# Patient Record
Sex: Female | Born: 1955 | Race: White | Hispanic: No | Smoking: Never smoker
Health system: Southern US, Community
[De-identification: ages and names within clinical notes are randomized; demographics above are authoritative.]

## PROBLEM LIST (undated history)

## (undated) DIAGNOSIS — F32A Depression, unspecified: Secondary | ICD-10-CM

## (undated) DIAGNOSIS — I10 Essential (primary) hypertension: Secondary | ICD-10-CM

## (undated) DIAGNOSIS — E119 Type 2 diabetes mellitus without complications: Secondary | ICD-10-CM

## (undated) DIAGNOSIS — L309 Dermatitis, unspecified: Secondary | ICD-10-CM

## (undated) DIAGNOSIS — K219 Gastro-esophageal reflux disease without esophagitis: Secondary | ICD-10-CM

## (undated) DIAGNOSIS — R569 Unspecified convulsions: Secondary | ICD-10-CM

## (undated) DIAGNOSIS — F329 Major depressive disorder, single episode, unspecified: Secondary | ICD-10-CM

## (undated) HISTORY — PX: CHOLECYSTECTOMY: SHX55

## (undated) HISTORY — PX: VAGINAL WOUND CLOSURE / REPAIR: SUR258

## (undated) HISTORY — PX: TONSILLECTOMY: SUR1361

---

## 2010-07-29 ENCOUNTER — Ambulatory Visit: Payer: Self-pay | Admitting: Family Medicine

## 2017-09-02 ENCOUNTER — Other Ambulatory Visit: Payer: Self-pay

## 2017-09-02 ENCOUNTER — Encounter: Payer: Self-pay | Admitting: *Deleted

## 2017-09-02 ENCOUNTER — Emergency Department
Admission: EM | Admit: 2017-09-02 | Discharge: 2017-09-02 | Disposition: A | Discharge status: Home / Self Care | Payer: Medicare Other | Attending: Emergency Medicine | Admitting: Emergency Medicine

## 2017-09-02 ENCOUNTER — Emergency Department: Payer: Medicare Other

## 2017-09-02 ENCOUNTER — Ambulatory Visit
Admission: EM | Admit: 2017-09-02 | Discharge: 2017-09-02 | Disposition: A | Discharge status: Other Acute Inpatient Hospital | Payer: Medicare Other | Attending: Family Medicine | Admitting: Family Medicine

## 2017-09-02 DIAGNOSIS — I10 Essential (primary) hypertension: Secondary | ICD-10-CM

## 2017-09-02 DIAGNOSIS — E119 Type 2 diabetes mellitus without complications: Secondary | ICD-10-CM | POA: Diagnosis not present

## 2017-09-02 DIAGNOSIS — R0789 Other chest pain: Secondary | ICD-10-CM | POA: Diagnosis not present

## 2017-09-02 DIAGNOSIS — E1169 Type 2 diabetes mellitus with other specified complication: Secondary | ICD-10-CM | POA: Diagnosis not present

## 2017-09-02 DIAGNOSIS — R0602 Shortness of breath: Secondary | ICD-10-CM | POA: Diagnosis not present

## 2017-09-02 DIAGNOSIS — E669 Obesity, unspecified: Secondary | ICD-10-CM

## 2017-09-02 DIAGNOSIS — Z79899 Other long term (current) drug therapy: Secondary | ICD-10-CM | POA: Insufficient documentation

## 2017-09-02 DIAGNOSIS — Z7982 Long term (current) use of aspirin: Secondary | ICD-10-CM | POA: Diagnosis not present

## 2017-09-02 DIAGNOSIS — R079 Chest pain, unspecified: Secondary | ICD-10-CM | POA: Diagnosis not present

## 2017-09-02 HISTORY — DX: Unspecified convulsions: R56.9

## 2017-09-02 HISTORY — DX: Type 2 diabetes mellitus without complications: E11.9

## 2017-09-02 HISTORY — DX: Essential (primary) hypertension: I10

## 2017-09-02 HISTORY — DX: Depression, unspecified: F32.A

## 2017-09-02 HISTORY — DX: Dermatitis, unspecified: L30.9

## 2017-09-02 HISTORY — DX: Major depressive disorder, single episode, unspecified: F32.9

## 2017-09-02 HISTORY — DX: Gastro-esophageal reflux disease without esophagitis: K21.9

## 2017-09-02 LAB — BASIC METABOLIC PANEL
Anion gap: 11 (ref 5–15)
BUN: 23 mg/dL — AB (ref 6–20)
CO2: 23 mmol/L (ref 22–32)
CREATININE: 0.75 mg/dL (ref 0.44–1.00)
Calcium: 9.7 mg/dL (ref 8.9–10.3)
Chloride: 105 mmol/L (ref 101–111)
GFR calc Af Amer: >60 mL/min (ref >60)
GLUCOSE: 225 mg/dL — AB (ref 65–99)
Potassium: 3.8 mmol/L (ref 3.5–5.1)
SODIUM: 139 mmol/L (ref 135–145)

## 2017-09-02 LAB — CBC
HEMATOCRIT: 39.4 % (ref 35.0–47.0)
Hemoglobin: 13.3 g/dL (ref 12.0–16.0)
MCH: 29.5 pg (ref 26.0–34.0)
MCHC: 33.7 g/dL (ref 32.0–36.0)
MCV: 87.4 fL (ref 80.0–100.0)
PLATELETS: 362 10*3/uL (ref 150–440)
RBC: 4.5 MIL/uL (ref 3.80–5.20)
RDW: 13.3 % (ref 11.5–14.5)
WBC: 9.6 10*3/uL (ref 3.6–11.0)

## 2017-09-02 LAB — TROPONIN I: Troponin I: <0.03 ng/mL (ref <0.03)

## 2017-09-02 MED ORDER — ASPIRIN 81 MG PO CHEW
324.0000 mg | CHEWABLE_TABLET | Freq: Once | ORAL | Status: AC
  Administered 2017-09-02: 324 mg via ORAL

## 2017-09-02 MED ORDER — METOPROLOL TARTRATE 50 MG PO TABS
50.0000 mg | ORAL_TABLET | Freq: Once | ORAL | Status: AC
  Administered 2017-09-02: 50 mg via ORAL

## 2017-09-02 MED ORDER — NITROGLYCERIN 0.4 MG SL SUBL
0.4000 mg | SUBLINGUAL_TABLET | SUBLINGUAL | Status: DC | PRN
  Administered 2017-09-02: 0.4 mg via SUBLINGUAL

## 2017-09-02 NOTE — ED Provider Notes (Signed)
[facility] Emergency Department Provider Note  ____________________________________________  Time seen: Approximately 8:14 PM  I have reviewed the triage vital signs and the nursing notes.   HISTORY  Chief Complaint Chest Pain    HPI April Burton is a 61 y.o. female comes to the ED due to central chest pain today radiating to left arm and jaw. Relieved with nitroglycerin yesterday. Had recurrent episode of chest pain today, went to urgent care and relieved when given a nitroglycerin.positive shortness of breath, no vomiting or diaphoresis. Not exertional, not pleuritic. Thinks this is similar to hiatal hernia related symptom she has had in the past. She's been compliant with omeprazole. No other acute complaints. No aggravating or alleviating factors to the symptoms which are intermittent. He describes the pain as dull.     Past Medical History:  Diagnosis Date  . Depression   . Diabetes mellitus without complication (HCC)   . Eczema   . GERD (gastroesophageal reflux disease)   . Hypertension   . Seizures (HCC)      There are no active problems to display for this patient.    Past Surgical History:  Procedure Laterality Date  . TONSILLECTOMY    . VAGINAL WOUND CLOSURE / REPAIR       Prior to Admission medications   Medication Sig Start Date End Date Taking? Authorizing Provider  aspirin EC 81 MG tablet Take 81 mg by mouth daily.    [provider]  atenolol (TENORMIN) 50 MG tablet Take 50 mg by mouth daily.    [provider]  atorvastatin (LIPITOR) 40 MG tablet Take 40 mg by mouth daily.    [provider]  cloNIDine (CATAPRES) 0.1 MG tablet Take 0.1 mg by mouth 3 (three) times daily.    [provider]  docusate sodium (COLACE) 100 MG capsule Take 100 mg by mouth 2 (two) times daily.    [provider]  enalapril (VASOTEC) 20 MG tablet Take 20 mg by mouth 2 (two) times daily.    [provider]  EPINEPHrine (EPIPEN 2-PAK) 0.3 mg/0.3 mL IJ SOAJ injection Inject 0.3 mg into the muscle once.    [provider]  esomeprazole (NEXIUM) 40 MG capsule Take 40 mg by mouth daily at 12 noon.    [provider]  FLUoxetine (PROZAC) 20 MG capsule Take 60 mg by mouth daily.    [provider]  gabapentin (NEURONTIN) 300 MG capsule Take 600 mg by mouth 3 (three) times daily.    [provider]  gemfibrozil (LOPID) 600 MG tablet Take 600 mg by mouth 2 (two) times daily before a meal.    [provider]  hydrochlorothiazide (HYDRODIURIL) 12.5 MG tablet Take 12.5 mg by mouth daily.    [provider]  ibuprofen (ADVIL,MOTRIN) 200 MG tablet Take 200 mg by mouth every 6 (six) hours as needed.    [provider]  insulin glargine (LANTUS) 100 UNIT/ML injection Inject 50 Units into the skin 2 (two) times daily.    [provider]  insulin lispro (HUMALOG) 100 UNIT/ML injection Inject into the skin 3 (three) times daily before meals.    [provider]  metFORMIN (GLUMETZA) 500 MG (MOD) 24 hr tablet Take 1,000 mg by mouth 2 (two) times daily with a meal.    [provider]  Multiple Vitamin (MULTIVITAMIN) tablet Take 1 tablet by mouth daily.    [provider]  Naloxone HCl (EVZIO) 0.4 MG/0.4ML SOAJ Inject  as directed.    [provider]  nystatin cream (MYCOSTATIN) Apply 1 application topically 2 (two) times daily.    [provider]  omega-3 acid ethyl esters (LOVAZA) 1 g capsule Take by mouth 2 (two) times daily.    [provider]  promethazine (PHENERGAN) 25 MG tablet Take 25 mg by mouth every 8 (eight) hours as needed for nausea or vomiting.    [provider]  SENNA-FENNEL PO Take 1 tablet by mouth 2 (two) times daily.    [provider]     Allergies Adhesive [tape]; Diclofenac sodium; Neomycin-bacitracin-polymyxin  [bacitracin-neomycin-polymyxin]; and Pregabalin   Family History  Problem Relation Age of Onset  . CAD Father     Social History Social History   Tobacco Use  . Smoking status: Never Smoker  . Smokeless tobacco: Never Used  Substance Use Topics  . Alcohol use: No    Frequency: Never  . Drug use: No    Review of Systems  Constitutional:   No fever or chills.  ENT:   No sore throat. No rhinorrhea. Cardiovascular:   positive as above chest pain without syncope. Respiratory:   positive as above shortness of breath with pain without cough. Gastrointestinal:   Negative for abdominal pain, vomiting and diarrhea.  Musculoskeletal:   Negative for focal pain or swelling All other systems reviewed and are negative except as documented above in ROS and HPI.  ____________________________________________   PHYSICAL EXAM:  VITAL SIGNS: ED Triage Vitals  Enc Vitals Group     BP 09/02/17 1544 (!) 159/64     Pulse Rate 09/02/17 1544 84     Resp 09/02/17 1544 18     Temp 09/02/17 1544 98.6 F (37 C)     Temp Source 09/02/17 1544 Oral     SpO2 09/02/17 1544 98 %     Weight 09/02/17 1538 210 lb (95.3 kg)     Height 09/02/17 1538 5\' 4"  (1.626 m)     Head Circumference --      Peak Flow --      Pain Score --      Pain Loc --      Pain Edu? --      Excl. in GC? --     Vital signs reviewed, nursing assessments reviewed.   Constitutional:   Alert and oriented. Well appearing and in no distress. Eyes:   No scleral icterus.  EOMI. No nystagmus. No conjunctival pallor. PERRL. ENT   Head:   Normocephalic and atraumatic.   Nose:   No congestion/rhinnorhea.    Mouth/Throat:   MMM, no pharyngeal erythema. No peritonsillar mass.    Neck:   No meningismus. Full ROM.thyroid nonpalpable Hematological/Lymphatic/Immunilogical:   No cervical lymphadenopathy. Cardiovascular:   RRR. Symmetric bilateral radial and DP pulses.  No murmurs.  Respiratory:   Normal respiratory effort  without tachypnea/retractions. Breath sounds are clear and equal bilaterally. No wheezes/rales/rhonchi. Gastrointestinal:   Soft and nontender. Non distended. There is no CVA tenderness.  No rebound, rigidity, or guarding. Genitourinary:   deferred Musculoskeletal:   Normal range of motion in all extremities. No joint effusions.  No lower extremity tenderness.  No edema. Neurologic:   Normal speech and language.  Motor grossly intact. No acute focal neurologic deficits are appreciated.  Skin:    Skin is warm, dry and intact. No rash noted.  No petechiae, purpura, or bullae.  ____________________________________________    LABS (pertinent positives/negatives) (all labs ordered are listed, but only abnormal  results are displayed) Labs Reviewed  BASIC METABOLIC PANEL - Abnormal; Notable for the following components:      Result Value   Glucose, Bld 225 (*)    BUN 23 (*)    All other components within normal limits  CBC  TROPONIN I  TROPONIN I   ____________________________________________   EKG  interpreted by me Normal sinus rhythm rate of 87, normal axis and intervals. Normal QRS ST segments and T waves. No acute ischemic changes.  [facility] reviewed by me, sinus rhythm, rate of 86, normal axis intervals. No acute ischemic changes.  ____________________________________________    RADIOLOGY  Dg Chest 2 View  Result Date: 09/02/2017 CLINICAL DATA:  Chest pain since yesterday, some relief with nitroglycerin, central chest pain extending to jaw, teeth and LEFT arm, history diabetes mellitus, hypertension EXAM: CHEST - 2 VIEW COMPARISON:  None FINDINGS: Normal heart size, mediastinal contours, and pulmonary vascularity. Lungs clear. No pleural effusion or pneumothorax. Multilevel endplate spur formation thoracic spine. IMPRESSION: No acute abnormalities. Electronically Signed   By: Ulyses SouthwardMark  Boles M.D.   On: 09/02/2017 16:15     ____________________________________________   PROCEDURES Procedures  ____________________________________________    CLINICAL IMPRESSION / ASSESSMENT AND PLAN / ED COURSE  Pertinent labs & imaging results that were available during my care of the patient were reviewed by me and considered in my medical decision making (see chart for details).     Clinical Course as of Sep 02 2029  Caleen EssexFri Sep 02, 2017  1701 the patient presents with precordial chest pain, concerning for cardiac origin. Chest pain resolved 2 hours after nitroglycerin. Once to go home. I'll check delta troponin, engage shared decision making with patient regarding appropriate management of her symptoms.  [PS]  2013 repeat troponin negative. No further pain. Discussed with patient regarding somewhat elevated risk of adverse cardiac event in the next 30 days. Recommended hospitalization and further workup tele monitoring. She strongly prefers to go home and follow up with cardiology outpatient. Advised to return to Hospital if she has any worsening or recurrent unremitting symptoms. We'll discharge, full dose aspirin until evaluated by cardiology.  [PS]    Clinical Course User Index [PS] Sharman CheekStafford, Shalamar Crays, MD     ____________________________________________   FINAL CLINICAL IMPRESSION(S) / ED DIAGNOSES    Final diagnoses:  Nonspecific chest pain     ED Discharge Orders    None      Portions of this note were generated with dragon dictation software. Dictation errors may occur despite best attempts at proofreading.    Sharman CheekStafford, Jun Osment, MD 09/02/17 2031

## 2017-09-02 NOTE — Discharge Instructions (Signed)
Increase your aspirin to 324mg  (4 tablets of 81mg  EC aspirin) a day until you see cardiology.

## 2017-09-02 NOTE — ED Triage Notes (Signed)
Pt started having chest pain yesterday, pt took a nitro yesterday with relief. Pt went to [facility] urgent care today for chest pain, pt states center of chest, jaw, teeth, and left arm. Pt denies pain at this time, pt states that she has similar symptoms with her hiatal hernia

## 2017-09-02 NOTE — ED Provider Notes (Signed)
[facility]    CSN: [id number] Arrival date & time: 09/02/17  1346     History   Chief Complaint Chief Complaint  Patient presents with  . Chest Pain  . Shortness of Breath    HPI April Kendallatricia Burton is a 62 y.o. female.   62 yo female with a multiple cardiac risk factors (diabetes, hypertension, hyperlipidemia) presents with a c/o approximately 30 minutes of substernal chest pressure radiating to her jaw/neck and left arm area and associated with shortness of breath.  States she's had these symptoms intermittently since Wednesday night and an episode yesterday which was relieved with sublingual nitroglycerin. States today she did not take her atenolol.    The history is provided by the patient.  Chest Pain  Associated symptoms: shortness of breath   Shortness of Breath  Associated symptoms: chest pain     Past Medical History:  Diagnosis Date  . Depression   . Diabetes mellitus without complication (HCC)   . Eczema   . GERD (gastroesophageal reflux disease)   . Hypertension   . Seizures (HCC)     There are no active problems to display for this patient.   History reviewed. No pertinent surgical history.  OB History    No data available       Home Medications    Prior to Admission medications   Medication Sig Start Date End Date Taking? Authorizing Provider  aspirin EC 81 MG tablet Take 81 mg by mouth daily.   Yes [provider]  atenolol (TENORMIN) 50 MG tablet Take 50 mg by mouth daily.   Yes [provider]  atorvastatin (LIPITOR) 40 MG tablet Take 40 mg by mouth daily.   Yes [provider]  cloNIDine (CATAPRES) 0.1 MG tablet Take 0.1 mg by mouth 3 (three) times daily.   Yes [provider]  docusate sodium (COLACE) 100 MG capsule Take 100 mg by mouth 2 (two) times daily.   Yes [provider]  enalapril (VASOTEC) 20 MG tablet Take 20 mg by mouth 2 (two) times daily.   Yes [provider]    EPINEPHrine (EPIPEN 2-PAK) 0.3 mg/0.3 mL IJ SOAJ injection Inject 0.3 mg into the muscle once.   Yes [provider]  esomeprazole (NEXIUM) 40 MG capsule Take 40 mg by mouth daily at 12 noon.   Yes [provider]  FLUoxetine (PROZAC) 20 MG capsule Take 60 mg by mouth daily.   Yes [provider]  gabapentin (NEURONTIN) 300 MG capsule Take 600 mg by mouth 3 (three) times daily.   Yes [provider]  gemfibrozil (LOPID) 600 MG tablet Take 600 mg by mouth 2 (two) times daily before a meal.   Yes [provider]  hydrochlorothiazide (HYDRODIURIL) 12.5 MG tablet Take 12.5 mg by mouth daily.   Yes [provider]  ibuprofen (ADVIL,MOTRIN) 200 MG tablet Take 200 mg by mouth every 6 (six) hours as needed.   Yes [provider]  insulin glargine (LANTUS) 100 UNIT/ML injection Inject 50 Units into the skin 2 (two) times daily.   Yes [provider]  insulin lispro (HUMALOG) 100 UNIT/ML injection Inject into the skin 3 (three) times daily before meals.   Yes [provider]  metFORMIN (GLUMETZA) 500 MG (MOD) 24 hr tablet Take 1,000 mg by mouth 2 (two) times daily with a meal.   Yes [provider]  Multiple Vitamin (MULTIVITAMIN) tablet Take 1 tablet by mouth daily.   Yes [provider]  Naloxone HCl (EVZIO) 0.4 MG/0.4ML SOAJ Inject as directed.   Yes [provider]  nystatin cream (MYCOSTATIN) Apply 1 application topically 2 (two) times daily.   Yes [provider]  omega-3 acid ethyl esters (LOVAZA) 1 g capsule Take by mouth 2 (two) times daily.   Yes [provider]  promethazine (PHENERGAN) 25 MG tablet Take 25 mg by mouth every 8 (eight) hours as needed for nausea or vomiting.   Yes [provider]  SENNA-FENNEL PO Take 1 tablet by mouth 2 (two) times daily.   Yes [provider]    Family History History reviewed. No pertinent family history.  Social  History Social History   Tobacco Use  . Smoking status: Never Smoker  . Smokeless tobacco: Never Used  Substance Use Topics  . Alcohol use: No    Frequency: Never  . Drug use: No     Allergies   Adhesive [tape]; Diclofenac sodium; Neomycin-bacitracin-polymyxin [bacitracin-neomycin-polymyxin]; and Pregabalin   Review of Systems Review of Systems  Respiratory: Positive for shortness of breath.   Cardiovascular: Positive for chest pain.     Physical Exam Triage Vital Signs ED Triage Vitals  Enc Vitals Group     BP 09/02/17 1403 (!) 200/84     Pulse Rate 09/02/17 1403 99     Resp 09/02/17 1403 18     Temp --      Temp src --      SpO2 09/02/17 1403 98 %     Weight 09/02/17 1402 210 lb (95.3 kg)     Height 09/02/17 1402 5\' 4"  (1.626 m)     Head Circumference --      Peak Flow --      Pain Score 09/02/17 1402 6     Pain Loc --      Pain Edu? --      Excl. in GC? --    No data found.  Updated Vital Signs BP (!) 200/84 (BP Location: Right Arm)   Pulse 99   Resp 18   Ht 5\' 4"  (1.626 m)   Wt 210 lb (95.3 kg)   SpO2 98%   BMI 36.05 kg/m   Visual Acuity Right Eye Distance:   Left Eye Distance:   Bilateral Distance:    Right Eye Near:   Left Eye Near:    Bilateral Near:     Physical Exam  Constitutional: She appears well-developed and well-nourished. No distress.  Cardiovascular: Normal rate, regular rhythm, normal heart sounds and intact distal pulses.  Pulmonary/Chest: Effort normal and breath sounds normal. No stridor. No respiratory distress. She has no wheezes. She has no rales. She exhibits no tenderness.  Abdominal: Soft. Bowel sounds are normal. She exhibits no distension and no mass. There is no tenderness. There is no rebound and no guarding.  Skin: She is not diaphoretic.  Nursing note and vitals reviewed.    UC Treatments / Results  Labs (all labs ordered are listed, but only abnormal results are displayed) Labs Reviewed - No data to  display  EKG  EKG Interpretation None       Radiology No results found.  Procedures ED EKG Date/Time: 09/02/2017 2:40 PM Performed by: Payton Mccallum, MD Authorized by: Payton Mccallum, MD   ECG reviewed by ED Physician in the absence of a cardiologist: yes   Previous ECG:    Previous ECG:  Compared to current   Similarity:  Changes noted Interpretation:    Interpretation: abnormal  Rate:    ECG rate:  86   ECG rate assessment: normal   Rhythm:    Rhythm: sinus rhythm   Ectopy:    Ectopy: PAC   QRS:    QRS axis:  Normal Conduction:    Conduction: normal   ST segments:    ST segments:  Normal T waves:    T waves: normal      (including critical care time)  Medications Ordered in UC Medications  nitroGLYCERIN (NITROSTAT) SL tablet 0.4 mg (0.4 mg Sublingual Given 09/02/17 1447)  aspirin chewable tablet 324 mg (324 mg Oral Given 09/02/17 1439)  metoprolol tartrate (LOPRESSOR) tablet 50 mg (50 mg Oral Given 09/02/17 1446)     Initial Impression / Assessment and Plan / UC Course  I have reviewed the triage vital signs and the nursing notes.  Pertinent labs & imaging results that were available during my care of the patient were reviewed by me and considered in my medical decision making (see chart for details).       Final Clinical Impressions(s) / UC Diagnoses   Final diagnoses:  Pressure in left side of chest  Diabetes mellitus type 2 in obese [facility])  Essential hypertension    ED Discharge Orders    None     1. Possible diagnosis reviewed with patient. Recommend patient go to Emergency Department by EMS for further evaluation and management. Patient given 4 baby ASA, SL NTG 0.4, metoprolol 50mg  po x 2. Report called to charge RN at [facility] ED.    Controlled Substance Prescriptions [place] Controlled Substance Registry consulted? Not Applicable   Payton Mccallum, MD 09/02/17 [phone]

## 2017-09-02 NOTE — ED Triage Notes (Signed)
Patient started having chest pain that radiates to her left arm and in her neck. Patient is presently being tapered of pain medications. Patient also reports anxiety.

## 2017-09-03 ENCOUNTER — Other Ambulatory Visit: Payer: Self-pay

## 2017-09-06 ENCOUNTER — Telehealth: Payer: Self-pay

## 2017-09-06 NOTE — Telephone Encounter (Signed)
Lmov for patient to call back and schedule ED fu appointment  °Seen on 09/02/17 for CP  ° °Will try again at a later time ° °

## 2017-09-07 NOTE — Telephone Encounter (Signed)
Lmov for patient to call back and schedule ED fu appointment  °Seen on 09/02/17 for CP  ° °Will try again at a later time ° °

## 2017-09-13 NOTE — Telephone Encounter (Signed)
Sent letter

## 2020-01-18 ENCOUNTER — Other Ambulatory Visit: Payer: Self-pay

## 2020-01-18 ENCOUNTER — Emergency Department: Payer: Medicare Other

## 2020-01-18 ENCOUNTER — Encounter: Payer: Self-pay | Admitting: *Deleted

## 2020-01-18 ENCOUNTER — Inpatient Hospital Stay
Admission: EM | Admit: 2020-01-18 | Discharge: 2020-01-23 | DRG: 871 | Disposition: A | Discharge status: Home / Self Care | Payer: Medicare Other | Attending: Internal Medicine | Admitting: Internal Medicine

## 2020-01-18 DIAGNOSIS — N390 Urinary tract infection, site not specified: Secondary | ICD-10-CM

## 2020-01-18 DIAGNOSIS — R410 Disorientation, unspecified: Secondary | ICD-10-CM | POA: Diagnosis present

## 2020-01-18 DIAGNOSIS — G8929 Other chronic pain: Secondary | ICD-10-CM | POA: Diagnosis present

## 2020-01-18 DIAGNOSIS — R4182 Altered mental status, unspecified: Secondary | ICD-10-CM

## 2020-01-18 DIAGNOSIS — I10 Essential (primary) hypertension: Secondary | ICD-10-CM

## 2020-01-18 DIAGNOSIS — G9341 Metabolic encephalopathy: Secondary | ICD-10-CM | POA: Diagnosis not present

## 2020-01-18 DIAGNOSIS — Z20822 Contact with and (suspected) exposure to covid-19: Secondary | ICD-10-CM | POA: Diagnosis present

## 2020-01-18 DIAGNOSIS — F411 Generalized anxiety disorder: Secondary | ICD-10-CM | POA: Diagnosis present

## 2020-01-18 DIAGNOSIS — K219 Gastro-esophageal reflux disease without esophagitis: Secondary | ICD-10-CM | POA: Diagnosis present

## 2020-01-18 DIAGNOSIS — F329 Major depressive disorder, single episode, unspecified: Secondary | ICD-10-CM | POA: Diagnosis present

## 2020-01-18 DIAGNOSIS — A4159 Other Gram-negative sepsis: Secondary | ICD-10-CM | POA: Diagnosis not present

## 2020-01-18 DIAGNOSIS — T424X1A Poisoning by benzodiazepines, accidental (unintentional), initial encounter: Secondary | ICD-10-CM | POA: Diagnosis present

## 2020-01-18 DIAGNOSIS — F32A Depression, unspecified: Secondary | ICD-10-CM

## 2020-01-18 DIAGNOSIS — Z7982 Long term (current) use of aspirin: Secondary | ICD-10-CM

## 2020-01-18 DIAGNOSIS — E785 Hyperlipidemia, unspecified: Secondary | ICD-10-CM | POA: Diagnosis present

## 2020-01-18 DIAGNOSIS — Z794 Long term (current) use of insulin: Secondary | ICD-10-CM

## 2020-01-18 DIAGNOSIS — F1120 Opioid dependence, uncomplicated: Secondary | ICD-10-CM

## 2020-01-18 DIAGNOSIS — E119 Type 2 diabetes mellitus without complications: Secondary | ICD-10-CM | POA: Diagnosis present

## 2020-01-18 DIAGNOSIS — D649 Anemia, unspecified: Secondary | ICD-10-CM

## 2020-01-18 DIAGNOSIS — T424X4A Poisoning by benzodiazepines, undetermined, initial encounter: Secondary | ICD-10-CM

## 2020-01-18 DIAGNOSIS — Z79899 Other long term (current) drug therapy: Secondary | ICD-10-CM

## 2020-01-18 DIAGNOSIS — Z8249 Family history of ischemic heart disease and other diseases of the circulatory system: Secondary | ICD-10-CM

## 2020-01-18 DIAGNOSIS — N12 Tubulo-interstitial nephritis, not specified as acute or chronic: Secondary | ICD-10-CM | POA: Diagnosis present

## 2020-01-18 LAB — CBC WITH DIFFERENTIAL/PLATELET
Abs Immature Granulocytes: 0.19 10*3/uL — ABNORMAL HIGH (ref 0.00–0.07)
Basophils Absolute: 0.1 10*3/uL (ref 0.0–0.1)
Basophils Relative: 0 %
Eosinophils Absolute: 0 10*3/uL (ref 0.0–0.5)
Eosinophils Relative: 0 %
HCT: 29.3 % — ABNORMAL LOW (ref 36.0–46.0)
Hemoglobin: 9.4 g/dL — ABNORMAL LOW (ref 12.0–15.0)
Immature Granulocytes: 1 %
Lymphocytes Relative: 4 %
Lymphs Abs: 0.9 10*3/uL (ref 0.7–4.0)
MCH: 28 pg (ref 26.0–34.0)
MCHC: 32.1 g/dL (ref 30.0–36.0)
MCV: 87.2 fL (ref 80.0–100.0)
Monocytes Absolute: 1.2 10*3/uL — ABNORMAL HIGH (ref 0.1–1.0)
Monocytes Relative: 5 %
Neutro Abs: 20.7 10*3/uL — ABNORMAL HIGH (ref 1.7–7.7)
Neutrophils Relative %: 90 %
Platelets: 480 10*3/uL — ABNORMAL HIGH (ref 150–400)
RBC: 3.36 MIL/uL — ABNORMAL LOW (ref 3.87–5.11)
RDW: 14.4 % (ref 11.5–15.5)
WBC: 23.1 10*3/uL — ABNORMAL HIGH (ref 4.0–10.5)
nRBC: 0 % (ref 0.0–0.2)

## 2020-01-18 LAB — LACTIC ACID, PLASMA: Lactic Acid, Venous: 1.4 mmol/L (ref 0.5–1.9)

## 2020-01-18 LAB — URINE DRUG SCREEN, QUALITATIVE (ARMC ONLY)
Amphetamines, Ur Screen: NOT DETECTED
Barbiturates, Ur Screen: NOT DETECTED
Benzodiazepine, Ur Scrn: POSITIVE — AB
Cannabinoid 50 Ng, Ur ~~LOC~~: NOT DETECTED
Cocaine Metabolite,Ur ~~LOC~~: NOT DETECTED
MDMA (Ecstasy)Ur Screen: NOT DETECTED
Methadone Scn, Ur: NOT DETECTED
Opiate, Ur Screen: NOT DETECTED
Phencyclidine (PCP) Ur S: NOT DETECTED
Tricyclic, Ur Screen: NOT DETECTED

## 2020-01-18 LAB — COMPREHENSIVE METABOLIC PANEL
ALT: 20 U/L (ref 0–44)
AST: 18 U/L (ref 15–41)
Albumin: 2.8 g/dL — ABNORMAL LOW (ref 3.5–5.0)
Alkaline Phosphatase: 88 U/L (ref 38–126)
Anion gap: 10 (ref 5–15)
BUN: 22 mg/dL (ref 8–23)
CO2: 28 mmol/L (ref 22–32)
Calcium: 9.2 mg/dL (ref 8.9–10.3)
Chloride: 95 mmol/L — ABNORMAL LOW (ref 98–111)
Creatinine, Ser: 0.84 mg/dL (ref 0.44–1.00)
GFR calc Af Amer: >60 mL/min (ref >60)
GFR calc non Af Amer: >60 mL/min (ref >60)
Glucose, Bld: 331 mg/dL — ABNORMAL HIGH (ref 70–99)
Potassium: 4.5 mmol/L (ref 3.5–5.1)
Sodium: 133 mmol/L — ABNORMAL LOW (ref 135–145)
Total Bilirubin: 1.5 mg/dL — ABNORMAL HIGH (ref 0.3–1.2)
Total Protein: 6.6 g/dL (ref 6.5–8.1)

## 2020-01-18 LAB — URINALYSIS, COMPLETE (UACMP) WITH MICROSCOPIC
Bilirubin Urine: NEGATIVE
Glucose, UA: >=500 mg/dL — AB
Hgb urine dipstick: NEGATIVE
Ketones, ur: 5 mg/dL — AB
Nitrite: NEGATIVE
Protein, ur: 30 mg/dL — AB
Specific Gravity, Urine: 1.015 (ref 1.005–1.030)
WBC, UA: >50 WBC/hpf — ABNORMAL HIGH (ref 0–5)
pH: 5 (ref 5.0–8.0)

## 2020-01-18 LAB — ACETAMINOPHEN LEVEL: Acetaminophen (Tylenol), Serum: <10 ug/mL — ABNORMAL LOW (ref 10–30)

## 2020-01-18 LAB — SALICYLATE LEVEL: Salicylate Lvl: <7 mg/dL — ABNORMAL LOW (ref 7.0–30.0)

## 2020-01-18 MED ORDER — SODIUM CHLORIDE 0.9 % IV BOLUS
500.0000 mL | Freq: Once | INTRAVENOUS | Status: AC
  Administered 2020-01-18: 500 mL via INTRAVENOUS

## 2020-01-18 MED ORDER — SODIUM CHLORIDE 0.9 % IV BOLUS (SEPSIS)
1000.0000 mL | Freq: Once | INTRAVENOUS | Status: AC
  Administered 2020-01-19: 1000 mL via INTRAVENOUS

## 2020-01-18 MED ORDER — SODIUM CHLORIDE 0.9 % IV SOLN
1.0000 g | INTRAVENOUS | Status: DC
  Filled 2020-01-18: qty 10

## 2020-01-18 MED ORDER — ACETAMINOPHEN 650 MG RE SUPP: 650.0000 mg | Freq: Four times a day (QID) | RECTAL | Status: DC | PRN

## 2020-01-18 MED ORDER — INSULIN ASPART 100 UNIT/ML ~~LOC~~ SOLN
0.0000 [IU] | Freq: Every day | SUBCUTANEOUS | Status: DC
  Administered 2020-01-19: 4 [IU] via SUBCUTANEOUS
  Administered 2020-01-19 – 2020-01-21 (×2): 3 [IU] via SUBCUTANEOUS
  Administered 2020-01-22: 2 [IU] via SUBCUTANEOUS
  Filled 2020-01-18 (×4): qty 1

## 2020-01-18 MED ORDER — SODIUM CHLORIDE 0.9 % IV SOLN
1.0000 g | Freq: Once | INTRAVENOUS | Status: AC
  Administered 2020-01-18: 1 g via INTRAVENOUS
  Filled 2020-01-18: qty 10

## 2020-01-18 MED ORDER — SODIUM CHLORIDE 0.9 % IV SOLN: INTRAVENOUS | Status: DC

## 2020-01-18 MED ORDER — ENOXAPARIN SODIUM 40 MG/0.4ML ~~LOC~~ SOLN
40.0000 mg | SUBCUTANEOUS | Status: DC
  Administered 2020-01-19 – 2020-01-23 (×5): 40 mg via SUBCUTANEOUS
  Filled 2020-01-18 (×6): qty 0.4

## 2020-01-18 MED ORDER — INSULIN ASPART 100 UNIT/ML ~~LOC~~ SOLN
0.0000 [IU] | Freq: Three times a day (TID) | SUBCUTANEOUS | Status: DC
  Administered 2020-01-19: 3 [IU] via SUBCUTANEOUS
  Administered 2020-01-19: 8 [IU] via SUBCUTANEOUS
  Administered 2020-01-19 – 2020-01-20 (×2): 5 [IU] via SUBCUTANEOUS
  Administered 2020-01-20: 15 [IU] via SUBCUTANEOUS
  Administered 2020-01-20: 8 [IU] via SUBCUTANEOUS
  Administered 2020-01-21: 5 [IU] via SUBCUTANEOUS
  Administered 2020-01-21: 3 [IU] via SUBCUTANEOUS
  Administered 2020-01-21: 8 [IU] via SUBCUTANEOUS
  Administered 2020-01-22: 5 [IU] via SUBCUTANEOUS
  Administered 2020-01-22: 4 [IU] via SUBCUTANEOUS
  Administered 2020-01-22 – 2020-01-23 (×3): 8 [IU] via SUBCUTANEOUS
  Filled 2020-01-18 (×15): qty 1

## 2020-01-18 MED ORDER — ACETAMINOPHEN 325 MG PO TABS
650.0000 mg | ORAL_TABLET | Freq: Four times a day (QID) | ORAL | Status: DC | PRN
  Administered 2020-01-19 – 2020-01-23 (×6): 650 mg via ORAL
  Filled 2020-01-18 (×6): qty 2

## 2020-01-18 MED ORDER — ONDANSETRON HCL 4 MG/2ML IJ SOLN: 4.0000 mg | Freq: Four times a day (QID) | INTRAMUSCULAR | Status: DC | PRN

## 2020-01-18 MED ORDER — SODIUM CHLORIDE 0.9 % IV BOLUS (SEPSIS)
500.0000 mL | Freq: Once | INTRAVENOUS | Status: AC
  Administered 2020-01-19: 500 mL via INTRAVENOUS

## 2020-01-18 MED ORDER — ACETAMINOPHEN 500 MG PO TABS
1000.0000 mg | ORAL_TABLET | Freq: Once | ORAL | Status: AC
  Administered 2020-01-18: 1000 mg via ORAL
  Filled 2020-01-18: qty 2

## 2020-01-18 MED ORDER — ONDANSETRON HCL 4 MG PO TABS: 4.0000 mg | ORAL_TABLET | Freq: Four times a day (QID) | ORAL | Status: DC | PRN

## 2020-01-18 NOTE — ED Triage Notes (Signed)
EMS report: Pt comes from home where she lives with her sister. Pt's daughter called EMS for her mother x 2 today. Initial EMS did not transport, pt refused and was not altered at that time. 2nd EMS called by daughter and when they arrived, pt was altered and eating her lipgloss. Pt is presently A&O x 2, person and place. Pt endorses taking valium and when asked pt admitted to taking heroin. When asked by primary RN, pt denied heroin use vehemently. Pt is variably attempting to get out of bed w/ less than determined effort. EMS reports pt had valium 2mg  x 90 tabs filled on 01/10/20 and has 11.5 tabs left today. Pt is also rx'd suboxone, but the count of that showed appropriate usage.

## 2020-01-18 NOTE — ED Notes (Signed)
Yellow bracelet and yellow socks on.

## 2020-01-18 NOTE — ED Provider Notes (Signed)
[facility] Emergency Department Provider Note ____________________________________________   First MD Initiated Contact with Patient 01/18/20 2134     (approximate)  I have reviewed the triage vital signs and the nursing notes.   HISTORY  Chief Complaint Altered Mental Status (Pt alternately admits and denies heroin use as well as overdose on Valium. )  Level 5 caveat: History present illness limited due to altered mental status  HPI April Burton is a 64 y.o. female with PMH as noted below who presents for altered mental status.  EMS reported that the daughter called earlier today, however at that time the patient was not altered and declined transport.  She called EMS again this evening, and at that time the patient appeared altered and was trying to eat her lip gloss.  The patient states that she takes Valium, and apparently admitted to the RN that she uses heroin although then denied this when asked again.  EMS reported that the patient had a prescription for 90 tablets of 2 mg Valium filled on 7/15 and has 11 tablets left today.  The patient herself denies acute complaints and states that her "doctor" wanted her to come to the hospital, although I believe she is trying to refer to her daughter.  She is not able to give much coherent history.  Past Medical History:  Diagnosis Date  . Depression   . Diabetes mellitus without complication (HCC)   . Eczema   . GERD (gastroesophageal reflux disease)   . Hypertension   . Seizures [facility])     Patient Active Problem List   Diagnosis Date Noted  . UTI (urinary tract infection) 01/18/2020  . Acute metabolic encephalopathy 01/18/2020  . Type 2 diabetes mellitus without complication (HCC) 01/18/2020  . HTN (hypertension) 01/18/2020  . Depression 01/18/2020  . Buprenorphine dependence (HCC) 01/18/2020  . Benzodiazepine (tranquilizer) overdose, undetermined intent, initial encounter 01/18/2020    Past  Surgical History:  Procedure Laterality Date  . TONSILLECTOMY    . VAGINAL WOUND CLOSURE / REPAIR      Prior to Admission medications   Medication Sig Start Date End Date Taking? Authorizing Provider  aspirin EC 81 MG tablet Take 81 mg by mouth daily.    [provider]  atenolol (TENORMIN) 50 MG tablet Take 50 mg by mouth daily.    [provider]  atorvastatin (LIPITOR) 40 MG tablet Take 40 mg by mouth daily.    [provider]  cloNIDine (CATAPRES) 0.1 MG tablet Take 0.1 mg by mouth 3 (three) times daily.    [provider]  docusate sodium (COLACE) 100 MG capsule Take 100 mg by mouth 2 (two) times daily.    [provider]  enalapril (VASOTEC) 20 MG tablet Take 20 mg by mouth 2 (two) times daily.    [provider]  EPINEPHrine (EPIPEN 2-PAK) 0.3 mg/0.3 mL IJ SOAJ injection Inject 0.3 mg into the muscle once.    [provider]  esomeprazole (NEXIUM) 40 MG capsule Take 40 mg by mouth daily at 12 noon.    [provider]  FLUoxetine (PROZAC) 20 MG capsule Take 60 mg by mouth daily.    [provider]  gabapentin (NEURONTIN) 300 MG capsule Take 600 mg by mouth 3 (three) times daily.    [provider]  gemfibrozil (LOPID) 600 MG tablet Take 600 mg by mouth 2 (two) times daily before a meal.    [provider]  hydrochlorothiazide (HYDRODIURIL) 12.5 MG tablet  Take 12.5 mg by mouth daily.    [provider]  ibuprofen (ADVIL,MOTRIN) 200 MG tablet Take 200 mg by mouth every 6 (six) hours as needed.    [provider]  insulin glargine (LANTUS) 100 UNIT/ML injection Inject 50 Units into the skin 2 (two) times daily.    [provider]  insulin lispro (HUMALOG) 100 UNIT/ML injection Inject into the skin 3 (three) times daily before meals.    [provider]  metFORMIN (GLUMETZA) 500 MG (MOD) 24 hr tablet Take 1,000 mg by mouth 2 (two) times daily with a meal.     [provider]  Multiple Vitamin (MULTIVITAMIN) tablet Take 1 tablet by mouth daily.    [provider]  Naloxone HCl (EVZIO) 0.4 MG/0.4ML SOAJ Inject as directed.    [provider]  nystatin cream (MYCOSTATIN) Apply 1 application topically 2 (two) times daily.    [provider]  omega-3 acid ethyl esters (LOVAZA) 1 g capsule Take by mouth 2 (two) times daily.    [provider]  promethazine (PHENERGAN) 25 MG tablet Take 25 mg by mouth every 8 (eight) hours as needed for nausea or vomiting.    [provider]  SENNA-FENNEL PO Take 1 tablet by mouth 2 (two) times daily.    [provider]    Allergies Adhesive [tape], Diclofenac sodium, Neomycin-bacitracin-polymyxin [bacitracin-neomycin-polymyxin], and Pregabalin  Family History  Problem Relation Age of Onset  . CAD Father     Social History Social History   Tobacco Use  . Smoking status: Never Smoker  . Smokeless tobacco: Never Used  Vaping Use  . Vaping Use: Never used  Substance Use Topics  . Alcohol use: No  . Drug use: Yes    Comment: questionable heroin use    Review of Systems Level 5 caveat: Unable to obtain review of systems due to altered mental status   ____________________________________________   PHYSICAL EXAM:  VITAL SIGNS: ED Triage Vitals  Enc Vitals Group     BP 01/18/20 2144 (!) 126/54     Pulse Rate 01/18/20 2144 102     Resp 01/18/20 2144 15     Temp 01/18/20 2144 (!) 102.8 F (39.3 C)     Temp Source 01/18/20 2144 Oral     SpO2 01/18/20 2142 97 %     Weight 01/18/20 2145 (!) 225 lb (102.1 kg)     Height 01/18/20 2145 5\' 8"  (1.727 m)     Head Circumference --      Peak Flow --      Pain Score 01/18/20 2145 0     Pain Loc --      Pain Edu? --      Excl. in GC? --     Constitutional: Alert, confused.  Somewhat weak appearing. Eyes: Conjunctivae are normal.  EOMI.  PERRLA. Head: Atraumatic. Nose: No  congestion/rhinnorhea. Mouth/Throat: Mucous membranes are very dry.   Neck: Normal range of motion.  Cardiovascular: Tachycardic, regular rhythm. Grossly normal heart sounds.  Good peripheral circulation. Respiratory: Normal respiratory effort.  No retractions. Lungs CTAB. Gastrointestinal: Soft and nontender. No distention.  Genitourinary: No flank tenderness. Musculoskeletal: No lower extremity edema.  Extremities warm and well perfused.  Neurologic: Slurred speech.  Motor intact in all extremities. Skin:  Skin is warm and dry. No rash noted. Psychiatric: Somewhat flat affect.  Tired appearing.  ____________________________________________   LABS (all labs ordered are listed, but only abnormal results are displayed)  Labs Reviewed  COMPREHENSIVE METABOLIC PANEL - Abnormal; Notable for the following components:      Result Value   Sodium 133 (*)    Chloride 95 (*)    Glucose, Bld 331 (*)    Albumin 2.8 (*)    Total Bilirubin 1.5 (*)    All other components within normal limits  CBC WITH DIFFERENTIAL/PLATELET - Abnormal; Notable for the following components:   WBC 23.1 (*)    RBC 3.36 (*)    Hemoglobin 9.4 (*)    HCT 29.3 (*)    Platelets 480 (*)    Neutro Abs 20.7 (*)    Monocytes Absolute 1.2 (*)    Abs Immature Granulocytes 0.19 (*)    All other components within normal limits  URINALYSIS, COMPLETE (UACMP) WITH MICROSCOPIC - Abnormal; Notable for the following components:   Color, Urine AMBER (*)    APPearance CLOUDY (*)    Glucose, UA >=500 (*)    Ketones, ur 5 (*)    Protein, ur 30 (*)    Leukocytes,Ua MODERATE (*)    WBC, UA >50 (*)    Bacteria, UA MANY (*)    All other components within normal limits  URINE DRUG SCREEN, QUALITATIVE ([facility] ONLY) - Abnormal; Notable for the following components:   Benzodiazepine, Ur Scrn POSITIVE (*)    All other components within normal limits  ACETAMINOPHEN LEVEL - Abnormal; Notable for the following components:   Acetaminophen  (Tylenol), Serum <10 (*)    All other components within normal limits  SALICYLATE LEVEL - Abnormal; Notable for the following components:   Salicylate Lvl <7.0 (*)    All other components within normal limits  CULTURE, BLOOD (ROUTINE X 2)  CULTURE, BLOOD (ROUTINE X 2)  SARS CORONAVIRUS 2 BY RT PCR (HOSPITAL ORDER, PERFORMED IN [facility])  LACTIC ACID, PLASMA  LACTIC ACID, PLASMA   ____________________________________________  EKG  ED ECG REPORT I, Dionne Bucy, the attending physician, personally viewed and interpreted this ECG.  Date: 01/18/2020 EKG Time: 2155 Rate: 101 Rhythm: Sinus tachycardia QRS Axis: normal Intervals: Intraventricular conduction delay ST/T Wave abnormalities: Nonspecific ST abnormalities Narrative Interpretation: Nonspecific abnormalities with no evidence of acute ischemia  ____________________________________________  RADIOLOGY  CT head: No ICH or other acute ab acute abnormality CXR: No focal infiltrate or edema  _________________________________________   PROCEDURES  Procedure(s) performed: No  Procedures  Critical Care performed: No ____________________________________________   INITIAL IMPRESSION / ASSESSMENT AND PLAN / ED COURSE  Pertinent labs & imaging results that were available during my care of the patient were reviewed by me and considered in my medical decision making (see chart for details).  64 year old female with PMH as noted above presents with altered mental status.  There is concern for possible Valium abuse given that there are fewer pills remaining from a recent prescription than there should be.  The patient herself has no acute complaints.  I reviewed the past medical records in Epic.  The patient was most recently seen in the ED here in 2019 with chest pain.  On exam, the patient is awake but tired appearing.  She is somewhat confused with slurred speech but otherwise nonfocal neurologic exam.   There is no visible trauma.  She is febrile and tachycardic with otherwise normal vital signs.  Given the presence of fever, differential includes infection/sepsis, versus possible Valium or other sedative medication overdose, versus metabolic etiology.  I have a low suspicion for CNS cause given the nonfocal neurologic exam.  We  will obtain CT head, chest x-ray, lab work-up including sepsis labs, and reassess.  I anticipate admission.  ----------------------------------------- 11:34 PM on 01/18/2020 -----------------------------------------  Lab workup and  UA are consistent with UTI.  WBC count is significantly elevated but lactate is normal.  UDS is positive for benzodiazepines.  CT head and CXR are negative.  I ordered ceftriaxone to treat for UTI.  I discussed the case with Dr. Para March from the hospitalist service for admission.  ____________________________________________   FINAL CLINICAL IMPRESSION(S) / ED DIAGNOSES  Final diagnoses:  Altered mental status, unspecified altered mental status type  Urinary tract infection without hematuria, site unspecified      NEW MEDICATIONS STARTED DURING THIS VISIT:  New Prescriptions   No medications on file     Note:  This document was prepared using Dragon voice recognition software and may include unintentional dictation errors.   Dionne Bucy, MD 01/18/20 [phone]

## 2020-01-18 NOTE — ED Notes (Signed)
CT tech states patient tried to remove IV. Sitter is at bedside. Mittens applied. IV site was wrapped with kling before going to CT. Patient is calm and cooperative at this time, but is impulsive.

## 2020-01-18 NOTE — ED Notes (Signed)
Patient was placed on 2L O2 for pulse ox at 90 percent on room air.

## 2020-01-18 NOTE — ED Notes (Addendum)
In and out cath performed by [facility] ED tech with this writer as second.

## 2020-01-18 NOTE — ED Notes (Signed)
Patient taken to CT scan.

## 2020-01-18 NOTE — ED Notes (Signed)
Report given to Meagan RN.

## 2020-01-18 NOTE — H&P (Signed)
History and Physical    April Kendallatricia Chovanec BJY:[id number] DOB: August 21, 1955 DOA: 01/18/2020  PCP: System, Pcp Not In   Patient coming from: Home  I have personally briefly reviewed patient's old medical [facility]  Chief Complaint: Altered mental status  HPI: April Burton is a 64 y.o. female with medical history significant for HTN, DM, chronic pain on Suboxone, anemia, who presented by EMS after daughter reported her being altered and eating her lip gloss.  EMS apparently went to the home twice for the day, the first time she was alert and oriented and declined transport to the hospital.  At the second visit patient was confused.  She reportedly admitted to EMS to be taking both Valium and heroin.  EMS reported that patient received prescription for 90 tablets of Valium on 01/10/2020 and now 7 days later only had 11.5 tablets left.  The Suboxone count showed appropriate usage.  History is limited due to altered mental status ED Course: On arrival, patient was febrile at 102.8, tachycardic at 102, BP 126/54 O2 sat 97% on room air.  WBC 23,000, hemoglobin 9.4.  Lactic acid 1.4.  Urinalysis with pyuria.  Acetaminophen and salicylate levels were both undetectable.  UDS showed positive benzos.  Chest x-ray and CT head with no active disease.  EKG, sinus rhythm with no acute ST-T wave changes.  Patient started on Rocephin.  Hospitalist consulted for admission.  At the time of my evaluation patient was more awake and alert and oriented, denying having taken more Valium than she should  Review of Systems: As per HPI otherwise all other systems on review of systems negative.    Past Medical History:  Diagnosis Date  . Depression   . Diabetes mellitus without complication (HCC)   . Eczema   . GERD (gastroesophageal reflux disease)   . Hypertension   . Seizures (HCC)     Past Surgical History:  Procedure Laterality Date  . TONSILLECTOMY    . VAGINAL WOUND CLOSURE / REPAIR        reports that she has never smoked. She has never used smokeless tobacco. She reports current drug use. She reports that she does not drink alcohol.  Allergies  Allergen Reactions  . Adhesive [Tape] Rash  . Diclofenac Sodium Other (See Comments)    Caused GERD to become worse.  Marland Kitchen. Neomycin-Bacitracin-Polymyxin [Bacitracin-Neomycin-Polymyxin] Rash  . Pregabalin Nausea And Vomiting    Family History  Problem Relation Age of Onset  . CAD Father       Prior to Admission medications   Medication Sig Start Date End Date Taking? Authorizing Provider  aspirin EC 81 MG tablet Take 81 mg by mouth daily.    [provider]  atenolol (TENORMIN) 50 MG tablet Take 50 mg by mouth daily.    [provider]  atorvastatin (LIPITOR) 40 MG tablet Take 40 mg by mouth daily.    [provider]  cloNIDine (CATAPRES) 0.1 MG tablet Take 0.1 mg by mouth 3 (three) times daily.    [provider]  docusate sodium (COLACE) 100 MG capsule Take 100 mg by mouth 2 (two) times daily.    [provider]  enalapril (VASOTEC) 20 MG tablet Take 20 mg by mouth 2 (two) times daily.    [provider]  EPINEPHrine (EPIPEN 2-PAK) 0.3 mg/0.3 mL IJ SOAJ injection Inject 0.3 mg into the muscle once.    [provider]  esomeprazole (NEXIUM) 40 MG capsule Take 40 mg by  mouth daily at 12 noon.    [provider]  FLUoxetine (PROZAC) 20 MG capsule Take 60 mg by mouth daily.    [provider]  gabapentin (NEURONTIN) 300 MG capsule Take 600 mg by mouth 3 (three) times daily.    [provider]  gemfibrozil (LOPID) 600 MG tablet Take 600 mg by mouth 2 (two) times daily before a meal.    [provider]  hydrochlorothiazide (HYDRODIURIL) 12.5 MG tablet Take 12.5 mg by mouth daily.    [provider]  ibuprofen (ADVIL,MOTRIN) 200 MG tablet Take 200 mg by mouth every 6 (six) hours as needed.    [provider]   insulin glargine (LANTUS) 100 UNIT/ML injection Inject 50 Units into the skin 2 (two) times daily.    [provider]  insulin lispro (HUMALOG) 100 UNIT/ML injection Inject into the skin 3 (three) times daily before meals.    [provider]  metFORMIN (GLUMETZA) 500 MG (MOD) 24 hr tablet Take 1,000 mg by mouth 2 (two) times daily with a meal.    [provider]  Multiple Vitamin (MULTIVITAMIN) tablet Take 1 tablet by mouth daily.    [provider]  Naloxone HCl (EVZIO) 0.4 MG/0.4ML SOAJ Inject as directed.    [provider]  nystatin cream (MYCOSTATIN) Apply 1 application topically 2 (two) times daily.    [provider]  omega-3 acid ethyl esters (LOVAZA) 1 g capsule Take by mouth 2 (two) times daily.    [provider]  promethazine (PHENERGAN) 25 MG tablet Take 25 mg by mouth every 8 (eight) hours as needed for nausea or vomiting.    [provider]  SENNA-FENNEL PO Take 1 tablet by mouth 2 (two) times daily.    [provider]    Physical Exam: Vitals:   01/18/20 2142 01/18/20 2144 01/18/20 2145  BP:  (!) 126/54   Pulse:  102   Resp:  15   Temp:  (!) 102.8 F (39.3 C)   TempSrc:  Oral   SpO2: 97% 95%   Weight:   (!) 102.1 kg  Height:   5\' 8"  (1.727 m)     Vitals:   01/18/20 2142 01/18/20 2144 01/18/20 2145  BP:  (!) 126/54   Pulse:  102   Resp:  15   Temp:  (!) 102.8 F (39.3 C)   TempSrc:  Oral   SpO2: 97% 95%   Weight:   (!) 102.1 kg  Height:   5\' 8"  (1.727 m)      Constitutional: Alert and oriented x 3 . Not in any apparent distress HEENT:      Head: Normocephalic and atraumatic.         Eyes: PERLA, EOMI, Conjunctivae are normal. Sclera is non-icteric.       Mouth/Throat: Mucous membranes are moist.       Neck: Supple with no signs of meningismus. Cardiovascular: Regular rate and rhythm. No murmurs, gallops, or rubs. 2+ symmetrical distal pulses are present . No JVD. No LE  edema Respiratory: Respiratory effort normal .Lungs sounds clear bilaterally. No wheezes, crackles, or rhonchi.  Gastrointestinal: Soft, non tende, and non distended with positive bowel sounds. No rebound or guarding. Genitourinary: No CVA tenderness. Musculoskeletal: Nontender with normal range of motion in all extremities. No cyanosis, or erythema of extremities. Neurologic: Normal speech and language. Face is symmetric. Moving all extremities. No gross focal neurologic deficits . Skin: Skin is warm, dry.  No rash or  ulcers Psychiatric: Mood and affect are normal Speech and behavior are normal   Labs on Admission: I have personally reviewed following labs and imaging studies  CBC: Recent Labs  Lab 01/18/20 2158  WBC 23.1*  NEUTROABS 20.7*  HGB 9.4*  HCT 29.3*  MCV 87.2  PLT 480*   Basic Metabolic Panel: Recent Labs  Lab 01/18/20 2158  NA 133*  K 4.5  CL 95*  CO2 28  GLUCOSE 331*  BUN 22  CREATININE 0.84  CALCIUM 9.2   GFR: Estimated Creatinine Clearance: 84.6 mL/min (by C-G formula based on SCr of 0.84 mg/dL). Liver Function Tests: Recent Labs  Lab 01/18/20 2158  AST 18  ALT 20  ALKPHOS 88  BILITOT 1.5*  PROT 6.6  ALBUMIN 2.8*   No results for input(s): LIPASE, AMYLASE in the last 168 hours. No results for input(s): AMMONIA in the last 168 hours. Coagulation Profile: No results for input(s): INR, PROTIME in the last 168 hours. Cardiac Enzymes: No results for input(s): CKTOTAL, CKMB, CKMBINDEX, TROPONINI in the last 168 hours. BNP (last 3 results) No results for input(s): PROBNP in the last 8760 hours. HbA1C: No results for input(s): HGBA1C in the last 72 hours. CBG: No results for input(s): GLUCAP in the last 168 hours. Lipid Profile: No results for input(s): CHOL, HDL, LDLCALC, TRIG, CHOLHDL, LDLDIRECT in the last 72 hours. Thyroid Function Tests: No results for input(s): TSH, T4TOTAL, FREET4, T3FREE, THYROIDAB in the last 72 hours. Anemia  Panel: No results for input(s): VITAMINB12, FOLATE, FERRITIN, TIBC, IRON, RETICCTPCT in the last 72 hours. Urine analysis:    Component Value Date/Time   COLORURINE AMBER (A) 01/18/2020 2240   APPEARANCEUR CLOUDY (A) 01/18/2020 2240   LABSPEC 1.015 01/18/2020 2240   PHURINE 5.0 01/18/2020 2240   GLUCOSEU >=500 (A) 01/18/2020 2240   HGBUR NEGATIVE 01/18/2020 2240   BILIRUBINUR NEGATIVE 01/18/2020 2240   KETONESUR 5 (A) 01/18/2020 2240   PROTEINUR 30 (A) 01/18/2020 2240   NITRITE NEGATIVE 01/18/2020 2240   LEUKOCYTESUR MODERATE (A) 01/18/2020 2240    Radiological Exams on Admission: CT Head Wo Contrast  Result Date: 01/18/2020 CLINICAL DATA:  Altered mental status. EXAM: CT HEAD WITHOUT CONTRAST TECHNIQUE: Contiguous axial images were obtained from the base of the skull through the vertex without intravenous contrast. COMPARISON:  None. FINDINGS: Brain: No evidence of acute infarction, hemorrhage, hydrocephalus, extra-axial collection or mass lesion/mass effect. Vascular: No hyperdense vessel or unexpected calcification. Skull: Normal. Negative for fracture or focal lesion. Sinuses/Orbits: No acute finding. Other: None. IMPRESSION: No acute intracranial pathology. Electronically Signed   By: Aram Candela M.D.   On: 01/18/2020 22:29   DG Chest Port 1 View  Result Date: 01/18/2020 CLINICAL DATA:  Fever EXAM: PORTABLE CHEST 1 VIEW COMPARISON:  09/02/2017 FINDINGS: The heart size and mediastinal contours are within normal limits. Both lungs are clear. The visualized skeletal structures are unremarkable. IMPRESSION: No active disease. Electronically Signed   By: Jasmine Pang M.D.   On: 01/18/2020 22:24    EKG: Independently reviewed. Interpretation : Sinus rhythm with no acute ST-T wave changes  Assessment/Plan 64 year old female HTN, DM, chronic pain on Suboxone, anemia, who presented by EMS after daughter reported her being altered.  Evidence of higher use of Valium on pill count.  On  arrival, febrile and tachycardic with leukocytosis and pyuria    Acute metabolic encephalopathy -Suspect primarily secondary to UTI resulting in confusion that may have led to overuse of Valium.  -IV hydration and supportive care -  Treat UTI -Neurologic checks with fall and aspiration precautions   UTI (urinary tract infection), possible early sepsis -Patient tachycardic and febrile on admission and with altered mental status, leukocytosis of 23,000 with normal lactic acid -Borderline sepsis criteria -IV antibiotics, Rocephin -IV hydration    Benzodiazepine (tranquilizer) overdose, undetermined intent, initial encounter - Pill count on Valium bottles showed 12 pills left from a prescription for 90 pills a week prior on 01/10/2020    Buprenorphine dependence (HCC) -Medication review by EMS reveals appropriate use of buprenorphine. -Patient apparently revealed to EMS that she took heroin and then walked it back.  She was confused at the time    Type 2 diabetes mellitus without complication (HCC) -Insulin sliding scale    HTN (hypertension) -Hold antihypertensives as blood pressure somewhat soft    Depression -Hold sedating psychotropics at this time    Anemia -Hemoglobin 9.4, not far from baseline 9.9 on review of records from care everywhere -Continue to monitor    DVT prophylaxis: Lovenox  Code Status: full code  Family Communication:  none  Disposition Plan: Back to previous home environment Consults called: none  Status:obs    Andris Baumann MD [facility]     01/18/2020, 11:42 PM

## 2020-01-19 DIAGNOSIS — G8929 Other chronic pain: Secondary | ICD-10-CM | POA: Diagnosis present

## 2020-01-19 DIAGNOSIS — F411 Generalized anxiety disorder: Secondary | ICD-10-CM | POA: Diagnosis present

## 2020-01-19 DIAGNOSIS — A419 Sepsis, unspecified organism: Secondary | ICD-10-CM

## 2020-01-19 DIAGNOSIS — E785 Hyperlipidemia, unspecified: Secondary | ICD-10-CM | POA: Diagnosis present

## 2020-01-19 DIAGNOSIS — F1120 Opioid dependence, uncomplicated: Secondary | ICD-10-CM | POA: Diagnosis not present

## 2020-01-19 DIAGNOSIS — Z20822 Contact with and (suspected) exposure to covid-19: Secondary | ICD-10-CM | POA: Diagnosis present

## 2020-01-19 DIAGNOSIS — E119 Type 2 diabetes mellitus without complications: Secondary | ICD-10-CM | POA: Diagnosis present

## 2020-01-19 DIAGNOSIS — T424X1A Poisoning by benzodiazepines, accidental (unintentional), initial encounter: Secondary | ICD-10-CM | POA: Diagnosis present

## 2020-01-19 DIAGNOSIS — G9341 Metabolic encephalopathy: Secondary | ICD-10-CM | POA: Diagnosis present

## 2020-01-19 DIAGNOSIS — Z794 Long term (current) use of insulin: Secondary | ICD-10-CM

## 2020-01-19 DIAGNOSIS — Z8249 Family history of ischemic heart disease and other diseases of the circulatory system: Secondary | ICD-10-CM | POA: Diagnosis not present

## 2020-01-19 DIAGNOSIS — N390 Urinary tract infection, site not specified: Secondary | ICD-10-CM | POA: Diagnosis present

## 2020-01-19 DIAGNOSIS — I10 Essential (primary) hypertension: Secondary | ICD-10-CM

## 2020-01-19 DIAGNOSIS — N12 Tubulo-interstitial nephritis, not specified as acute or chronic: Secondary | ICD-10-CM | POA: Diagnosis present

## 2020-01-19 DIAGNOSIS — K219 Gastro-esophageal reflux disease without esophagitis: Secondary | ICD-10-CM | POA: Diagnosis present

## 2020-01-19 DIAGNOSIS — F329 Major depressive disorder, single episode, unspecified: Secondary | ICD-10-CM

## 2020-01-19 DIAGNOSIS — Z7982 Long term (current) use of aspirin: Secondary | ICD-10-CM | POA: Diagnosis not present

## 2020-01-19 DIAGNOSIS — D649 Anemia, unspecified: Secondary | ICD-10-CM | POA: Diagnosis present

## 2020-01-19 DIAGNOSIS — A4159 Other Gram-negative sepsis: Secondary | ICD-10-CM | POA: Diagnosis present

## 2020-01-19 DIAGNOSIS — Z79899 Other long term (current) drug therapy: Secondary | ICD-10-CM | POA: Diagnosis not present

## 2020-01-19 LAB — SARS CORONAVIRUS 2 BY RT PCR (HOSPITAL ORDER, PERFORMED IN ~~LOC~~ HOSPITAL LAB): SARS Coronavirus 2: NEGATIVE

## 2020-01-19 LAB — BASIC METABOLIC PANEL
Anion gap: 8 (ref 5–15)
BUN: 25 mg/dL — ABNORMAL HIGH (ref 8–23)
CO2: 29 mmol/L (ref 22–32)
Calcium: 9.1 mg/dL (ref 8.9–10.3)
Chloride: 98 mmol/L (ref 98–111)
Creatinine, Ser: 0.84 mg/dL (ref 0.44–1.00)
GFR calc Af Amer: >60 mL/min (ref >60)
GFR calc non Af Amer: >60 mL/min (ref >60)
Glucose, Bld: 276 mg/dL — ABNORMAL HIGH (ref 70–99)
Potassium: 4.3 mmol/L (ref 3.5–5.1)
Sodium: 135 mmol/L (ref 135–145)

## 2020-01-19 LAB — HEMOGLOBIN A1C
Hgb A1c MFr Bld: 8.6 % — ABNORMAL HIGH (ref 4.8–5.6)
Mean Plasma Glucose: 200.12 mg/dL

## 2020-01-19 LAB — CBC
HCT: 25.8 % — ABNORMAL LOW (ref 36.0–46.0)
Hemoglobin: 8.6 g/dL — ABNORMAL LOW (ref 12.0–15.0)
MCH: 28.7 pg (ref 26.0–34.0)
MCHC: 33.3 g/dL (ref 30.0–36.0)
MCV: 86 fL (ref 80.0–100.0)
Platelets: 506 10*3/uL — ABNORMAL HIGH (ref 150–400)
RBC: 3 MIL/uL — ABNORMAL LOW (ref 3.87–5.11)
RDW: 14.6 % (ref 11.5–15.5)
WBC: 23.3 10*3/uL — ABNORMAL HIGH (ref 4.0–10.5)
nRBC: 0 % (ref 0.0–0.2)

## 2020-01-19 LAB — HIV ANTIBODY (ROUTINE TESTING W REFLEX): HIV Screen 4th Generation wRfx: NONREACTIVE

## 2020-01-19 LAB — GLUCOSE, CAPILLARY
Glucose-Capillary: 269 mg/dL — ABNORMAL HIGH (ref 70–99)
Glucose-Capillary: 197 mg/dL — ABNORMAL HIGH (ref 70–99)
Glucose-Capillary: 273 mg/dL — ABNORMAL HIGH (ref 70–99)
Glucose-Capillary: 315 mg/dL — ABNORMAL HIGH (ref 70–99)

## 2020-01-19 LAB — PROCALCITONIN: Procalcitonin: 2.48 ng/mL

## 2020-01-19 LAB — APTT: aPTT: 29 seconds (ref 24–36)

## 2020-01-19 LAB — LACTIC ACID, PLASMA: Lactic Acid, Venous: 1.3 mmol/L (ref 0.5–1.9)

## 2020-01-19 LAB — PROTIME-INR
INR: 1.2 (ref 0.8–1.2)
Prothrombin Time: 15.1 seconds (ref 11.4–15.2)

## 2020-01-19 MED ORDER — SODIUM CHLORIDE 0.9 % IV SOLN
2.0000 g | INTRAVENOUS | Status: DC
  Administered 2020-01-19 – 2020-01-20 (×2): 2 g via INTRAVENOUS
  Filled 2020-01-19: qty 2
  Filled 2020-01-19: qty 20
  Filled 2020-01-19: qty 2
  Filled 2020-01-19: qty 20

## 2020-01-19 MED ORDER — INSULIN GLARGINE 100 UNIT/ML ~~LOC~~ SOLN
20.0000 [IU] | Freq: Every day | SUBCUTANEOUS | Status: DC
  Administered 2020-01-19: 20 [IU] via SUBCUTANEOUS
  Filled 2020-01-19 (×3): qty 0.2

## 2020-01-19 MED ORDER — FAMOTIDINE 20 MG PO TABS
40.0000 mg | ORAL_TABLET | Freq: Every day | ORAL | Status: DC
  Administered 2020-01-19 – 2020-01-22 (×4): 40 mg via ORAL
  Filled 2020-01-19 (×4): qty 2

## 2020-01-19 MED ORDER — ATORVASTATIN CALCIUM 20 MG PO TABS
40.0000 mg | ORAL_TABLET | Freq: Every day | ORAL | Status: DC
  Administered 2020-01-19 – 2020-01-22 (×4): 40 mg via ORAL
  Filled 2020-01-19 (×4): qty 2

## 2020-01-19 MED ORDER — ATENOLOL 25 MG PO TABS
25.0000 mg | ORAL_TABLET | Freq: Two times a day (BID) | ORAL | Status: DC
  Administered 2020-01-19 – 2020-01-23 (×9): 25 mg via ORAL
  Filled 2020-01-19 (×10): qty 1

## 2020-01-19 MED ORDER — PANTOPRAZOLE SODIUM 40 MG PO TBEC
40.0000 mg | DELAYED_RELEASE_TABLET | Freq: Every day | ORAL | Status: DC
  Administered 2020-01-19 – 2020-01-23 (×6): 40 mg via ORAL
  Filled 2020-01-19 (×6): qty 1

## 2020-01-19 MED ORDER — SODIUM CHLORIDE 0.9 % IV SOLN
1.0000 g | Freq: Once | INTRAVENOUS | Status: AC
  Administered 2020-01-19: 1 g via INTRAVENOUS
  Filled 2020-01-19: qty 1

## 2020-01-19 NOTE — Progress Notes (Signed)
Received patient into 148. Patient alert and oriented x 4 and showing no signs of distress. Vital signs stable. Call bell within reach.

## 2020-01-19 NOTE — Progress Notes (Signed)
   01/19/20 1019  Assess: MEWS Score  Temp (!) 103.2 F (39.6 C)  BP (!) 154/55  Pulse Rate (!) 108  Resp (!) 24  SpO2 97 %  O2 Device Room Air  Assess: MEWS Score  MEWS Temp 2  MEWS Systolic 0  MEWS Pulse 1  MEWS RR 1  MEWS LOC 0  MEWS Score 4  MEWS Score Color Red  Assess: if the MEWS score is Yellow or Red  Were vital signs taken at a resting state? Yes  Focused Assessment Change from prior assessment (see assessment flowsheet)  Early Detection of Sepsis Score *See Row Information* High  MEWS guidelines implemented *See Row Information* Yes  Treat  MEWS Interventions Administered scheduled meds/treatments  Take Vital Signs  Increase Vital Sign Frequency  Red: Q 1hr X 4 then Q 4hr X 4, if remains red, continue Q 4hrs  Escalate  MEWS: Escalate Red: discuss with charge nurse/RN and provider, consider discussing with RRT  Notify: Charge Nurse/RN  Name of Charge Nurse/RN Notified Rosey Bath  Date Charge Nurse/RN Notified 01/19/20  Time Charge Nurse/RN Notified 1030  Notify: Provider  Provider Name/Title Nettey  Date Provider Notified 01/19/20  Time Provider Notified 1030  Notification Type Page  Notification Reason Change in status  Response No new orders  Date of Provider Response 01/19/20  Time of Provider Response 1100  Document  Patient Outcome Stabilized after interventions  Progress note created (see row info) Yes
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
## 2020-01-19 NOTE — Progress Notes (Signed)
CODE SEPSIS - PHARMACY COMMUNICATION  **Broad Spectrum Antibiotics should be administered within 1 hour of Sepsis diagnosis**  Time Code Sepsis Called/Page Received: 2356  Antibiotics Ordered: Rocephin  Time of 1st antibiotic administration: 2332 (already given)  Additional action taken by pharmacy: n/a  If necessary, Name of Provider/Nurse Contacted: n/a    Wayland Denis ,PharmD Clinical Pharmacist  01/19/2020  2:29 AM
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
## 2020-01-19 NOTE — Progress Notes (Signed)
Bladder scanned her just now after urinating twice and no urine is being retained in bladder.

## 2020-01-19 NOTE — Progress Notes (Signed)
Called lab to add urine culture to urine already in lab

## 2020-01-20 LAB — IRON AND TIBC
Iron: 21 ug/dL — ABNORMAL LOW (ref 28–170)
Saturation Ratios: 12 % (ref 10.4–31.8)
TIBC: 179 ug/dL — ABNORMAL LOW (ref 250–450)
UIBC: 158 ug/dL

## 2020-01-20 LAB — CBC
HCT: 29.3 % — ABNORMAL LOW (ref 36.0–46.0)
Hemoglobin: 9.3 g/dL — ABNORMAL LOW (ref 12.0–15.0)
MCH: 28.5 pg (ref 26.0–34.0)
MCHC: 31.7 g/dL (ref 30.0–36.0)
MCV: 89.9 fL (ref 80.0–100.0)
Platelets: 569 10*3/uL — ABNORMAL HIGH (ref 150–400)
RBC: 3.26 MIL/uL — ABNORMAL LOW (ref 3.87–5.11)
RDW: 14.8 % (ref 11.5–15.5)
WBC: 22 10*3/uL — ABNORMAL HIGH (ref 4.0–10.5)
nRBC: 0 % (ref 0.0–0.2)

## 2020-01-20 LAB — GLUCOSE, CAPILLARY
Glucose-Capillary: 244 mg/dL — ABNORMAL HIGH (ref 70–99)
Glucose-Capillary: 293 mg/dL — ABNORMAL HIGH (ref 70–99)
Glucose-Capillary: 384 mg/dL — ABNORMAL HIGH (ref 70–99)
Glucose-Capillary: 183 mg/dL — ABNORMAL HIGH (ref 70–99)

## 2020-01-20 LAB — PROCALCITONIN: Procalcitonin: 1.76 ng/mL

## 2020-01-20 LAB — FERRITIN: Ferritin: 447 ng/mL — ABNORMAL HIGH (ref 11–307)

## 2020-01-20 MED ORDER — INSULIN GLARGINE 100 UNIT/ML ~~LOC~~ SOLN
40.0000 [IU] | Freq: Every day | SUBCUTANEOUS | Status: DC
  Administered 2020-01-20 – 2020-01-22 (×3): 40 [IU] via SUBCUTANEOUS
  Filled 2020-01-20 (×3): qty 0.4

## 2020-01-20 NOTE — Progress Notes (Signed)
PROGRESS NOTE    April Burton  YNW:[id number] DOB: Mar 24, 1956 DOA: 01/18/2020 PCP: System, Pcp Not In   Brief Narrative: April Burton is a 64 y.o. female with medical history significant for HTN, DM, chronic pain on Suboxone, anemia, depression. Patient presented secondary to altered mental status in setting of UTI and sepsis, present on admission. Patient started empirically on ceftriaxone and blood cultures obtained. No urine culture obtained on admission.   Assessment & Plan:   Principal Problem:   Acute metabolic encephalopathy Active Problems:   UTI (urinary tract infection)   Type 2 diabetes mellitus without complication (HCC)   HTN (hypertension)   Depression   Buprenorphine dependence (HCC)   Benzodiazepine (tranquilizer) overdose, undetermined intent, initial encounter   Anemia   Acute metabolic encephalopathy Present on admission. Patient has some confusion. Some of this appear to be behavioral, however. Resolved. -Treat infection below  UTI Pyelonephritis Urinalysis is highly suggestive of infection. Since patient incurred high fevers, will treat as pyelonephritis. Blood cultures are no growth to date. Urine culture significant for Enterobacter hormaechei; sensitivities pending -Continue Ceftriaxone 2g daily -Follow up blood and urine cultures (urine culture added on to admission urine collection) -PT/OT recommendations pending  Sepsis Present on admission with fever and leukocytosis. Secondary to UTI. No lactic acidosis. No hypotension. Leukocytosis still significantly elevated but trended down slightly. -Antibiotics as mentioned above  Benzodiazepine overdose Per report, this appears to be unintentional. Patient confused and taking more pills than necessary. No symptoms of overdose currently.  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 Patient plans on discontinuing this after discussion with her primary physician.  Essential  hypertension Patient is on atenolol, enalapril, hydrochlorothiazide, spironolactone as an outpatient. BP was soft on admission. Patient appears to have some rebound tachycardia vs tachycardia from infection. -Restart home atenolol and hold other antihypertensives  Diabetes mellitus, type 2 Patient is on Lantus 65 units BID, Humalog 35 units TID with meals, metformin and Ozempic as an outpatient. Last hemoglobin A1C of 8.6 (01/19/2020). Started on SSI inpatient. Blood sugar uncontrolled today.  -Continue SSI -Increase to Lantus 40 units daily  Depression Patient is on Cymbalta 60 mg BID which is a very high dose. Will need to clarify that she is taking this medication. She is also on a high dose of gabapentin at 900 mg TID.  Hyperlipidemia -Continue Lipitor  Anemia Unknown if this is new or chronic. Last hemoglobin available is from 2019. No evidence of bleeding. Iron panel suggests chronic disease picture as anticipated. Will hold off on iron supplementation for now. Anemia is stable.  GERD -Continue Nexium (Protonix while inpatient) and Pepcid   DVT prophylaxis: Lovenox Code Status:   Code Status: Full Code Family Communication: None at bedside Disposition Plan: Discharge in several days, possibly home pending continued management of infection and eventual PT/OT evaluation when mental status improved   Consultants:   None  Procedures:   None  Antimicrobials:  Ceftriaxone    Subjective: Feels much better. Still eager to go home. No other concerns.  Objective: Vitals:   01/19/20 2054 01/19/20 2224 01/20/20 0223 01/20/20 0620  BP: (!) 145/70 (!) 143/54 (!) 124/51 (!) 137/63  Pulse: 87 83 75 88  Resp:  18 16 22   Temp:  99.4 F (37.4 C) 98.7 F (37.1 C) 99.4 F (37.4 C)  TempSrc:  Oral Oral Oral  SpO2: 100% 100% 100% 100%  Weight:      Height:        Intake/Output Summary (  Last 24 hours) at 01/20/2020 0743 Last data filed at 01/20/2020 0536 Gross per 24 hour    Intake 896 ml  Output 3351 ml  Net -2455 ml   Filed Weights   01/18/20 2145  Weight: (!) 102.1 kg    Examination:  General exam: Appears calm and comfortable Respiratory system: Clear to auscultation. Respiratory effort normal. Cardiovascular system: S1 & S2 heard, RRR. No murmurs, rubs, gallops or clicks. Gastrointestinal system: Abdomen is nondistended, soft and nontender. No organomegaly or masses felt. Normal bowel sounds heard. Central nervous system: Alert and oriented. No focal neurological deficits. Musculoskeletal: No edema. No calf tenderness Skin: No cyanosis. No rashes Psychiatry: Judgement and insight appear normal. Mood & affect appropriate.     Data Reviewed: I have personally reviewed following labs and imaging studies  CBC Lab Results  Component Value Date   WBC 22.0 (H) 01/20/2020   RBC 3.26 (L) 01/20/2020   HGB 9.3 (L) 01/20/2020   HCT 29.3 (L) 01/20/2020   MCV 89.9 01/20/2020   MCH 28.5 01/20/2020   PLT 569 (H) 01/20/2020   MCHC 31.7 01/20/2020   RDW 14.8 01/20/2020   LYMPHSABS 0.9 01/18/2020   MONOABS 1.2 (H) 01/18/2020   EOSABS 0.0 01/18/2020   BASOSABS 0.1 01/18/2020     Last metabolic panel Lab Results  Component Value Date   NA 135 01/19/2020   K 4.3 01/19/2020   CL 98 01/19/2020   CO2 29 01/19/2020   BUN 25 (H) 01/19/2020   CREATININE 0.84 01/19/2020   GLUCOSE 276 (H) 01/19/2020   GFRNONAA >60 01/19/2020   GFRAA >60 01/19/2020   CALCIUM 9.1 01/19/2020   PROT 6.6 01/18/2020   ALBUMIN 2.8 (L) 01/18/2020   BILITOT 1.5 (H) 01/18/2020   ALKPHOS 88 01/18/2020   AST 18 01/18/2020   ALT 20 01/18/2020   ANIONGAP 8 01/19/2020    CBG (last 3)  Recent Labs    01/19/20 0743 01/19/20 1640 01/19/20 2048  GLUCAP 197* 273* 315*     GFR: Estimated Creatinine Clearance: 84.6 mL/min (by C-G formula based on SCr of 0.84 mg/dL).  Coagulation Profile: Recent Labs  Lab 01/19/20 0247  INR 1.2    Recent Results (from the past 240  hour(s))  Blood Culture (routine x 2)     Status: None (Preliminary result)   Collection Time: 01/18/20  9:58 PM   Specimen: BLOOD  Result Value Ref Range Status   Specimen Description BLOOD BLOOD LEFT FOREARM  Final   Special Requests   Final    BOTTLES DRAWN AEROBIC AND ANAEROBIC Blood Culture results may not be optimal due to an excessive volume of blood received in culture bottles   Culture   Final    NO GROWTH < 12 HOURS Performed at [facility], [address], [place], [place] [address]    Report Status PENDING  Incomplete  Blood Culture (routine x 2)     Status: None (Preliminary result)   Collection Time: 01/18/20 10:08 PM   Specimen: BLOOD  Result Value Ref Range Status   Specimen Description BLOOD BLOOD RIGHT FOREARM  Final   Special Requests   Final    BOTTLES DRAWN AEROBIC AND ANAEROBIC Blood Culture adequate volume   Culture   Final    NO GROWTH < 12 HOURS Performed at [facility], [address], [place], [place] [address]    Report Status PENDING  Incomplete  SARS Coronavirus 2 by RT PCR (hospital order, performed in [facility]  hospital lab) Nasopharyngeal Nasopharyngeal Swab     Status: None   Collection Time: 01/18/20 11:35 PM   Specimen: Nasopharyngeal Swab  Result Value Ref Range Status   SARS Coronavirus 2 NEGATIVE NEGATIVE Final    Comment: (NOTE) SARS-CoV-2 target nucleic acids are NOT DETECTED.  The SARS-CoV-2 RNA is generally detectable in upper and lower respiratory specimens during the acute phase of infection. The lowest concentration of SARS-CoV-2 viral copies this assay can detect is 250 copies / mL. A negative result does not preclude SARS-CoV-2 infection and should not be used as the sole basis for treatment or other patient management decisions.  A negative result may occur with improper specimen collection / handling, submission of specimen other than nasopharyngeal swab, presence of viral mutation(s) within the areas  targeted by this assay, and inadequate number of viral copies (<250 copies / mL). A negative result must be combined with clinical observations, patient history, and epidemiological information.  Fact Sheet for Patients:   BoilerBrush.com.cy  Fact Sheet for Healthcare Providers: https://pope.com/  This test is not yet approved or  cleared by the [place] FDA and has been authorized for detection and/or diagnosis of SARS-CoV-2 by FDA under an Emergency Use Authorization (EUA).  This EUA will remain in effect (meaning this test can be used) for the duration of the COVID-19 declaration under Section 564(b)(1) of the Act, 21 U.S.C. section 360bbb-3(b)(1), unless the authorization is terminated or revoked sooner.  Performed at [facility], [address], [place], [place] [address]         Radiology Studies: CT Head Wo Contrast  Result Date: 01/18/2020 CLINICAL DATA:  Altered mental status. EXAM: CT HEAD WITHOUT CONTRAST TECHNIQUE: Contiguous axial images were obtained from the base of the skull through the vertex without intravenous contrast. COMPARISON:  None. FINDINGS: Brain: No evidence of acute infarction, hemorrhage, hydrocephalus, extra-axial collection or mass lesion/mass effect. Vascular: No hyperdense vessel or unexpected calcification. Skull: Normal. Negative for fracture or focal lesion. Sinuses/Orbits: No acute finding. Other: None. IMPRESSION: No acute intracranial pathology. Electronically Signed   By: Aram Candela M.D.   On: 01/18/2020 22:29   DG Chest Port 1 View  Result Date: 01/18/2020 CLINICAL DATA:  Fever EXAM: PORTABLE CHEST 1 VIEW COMPARISON:  09/02/2017 FINDINGS: The heart size and mediastinal contours are within normal limits. Both lungs are clear. The visualized skeletal structures are unremarkable. IMPRESSION: No active disease. Electronically Signed   By: Jasmine Pang M.D.   On: 01/18/2020  22:24        Scheduled Meds: . atenolol  25 mg Oral BID  . atorvastatin  40 mg Oral QHS  . enoxaparin (LOVENOX) injection  40 mg Subcutaneous Q24H  . famotidine  40 mg Oral QHS  . insulin aspart  0-15 Units Subcutaneous TID WC  . insulin aspart  0-5 Units Subcutaneous QHS  . insulin glargine  40 Units Subcutaneous Daily  . pantoprazole  40 mg Oral Daily   Continuous Infusions: . sodium chloride 100 mL/hr at 01/19/20 2047  . cefTRIAXone (ROCEPHIN)  IV 2 g (01/19/20 2109)     LOS: 1 day     Jacquelin Hawking, MD [facility] 01/20/2020, 7:43 AM  If 7PM-7AM, please contact night-coverage [email or web address]

## 2020-01-20 NOTE — Evaluation (Signed)
Physical Therapy Evaluation Patient Details Name: April Burton MRN: [id number] DOB: Dec 11, 1955 Today's Date: 01/20/2020   History of Present Illness  Patient is a 63 y/o F that presents with confusion/AMS, UTI, and sepsis. She had an apparent overdose accidentally it appears per medical records.  Clinical Impression  Patient is a 64 y/o F that is independent with mobility at baseline. She was standing in the room when PT entered, was able to perform 5x sit to stand test quickly w/o use of UEs and able to complete all mobility and transfers independently. She was able to ambulate a full lap around RN station at appropriate gait speed, no obvious deficits observed. No needs identified, PT will sign off.      No PT follow up    Equipment Recommendations       Recommendations for Other Services       Precautions / Restrictions Precautions Precautions: None Restrictions Weight Bearing Restrictions: No      Mobility  Bed Mobility Overal bed mobility: Independent             General bed mobility comments: No deficits observed  Transfers Overall transfer level: Independent               General transfer comment: No deficits observed  Ambulation/Gait Ambulation/Gait assistance: Independent Gait Distance (Feet): 200 Feet Assistive device: None Gait Pattern/deviations: WFL(Within Functional Limits)   Gait velocity interpretation: 1.31 - 2.62 ft/sec, indicative of limited community ambulator General Gait Details: No obvious deficits observed with ambulation.  Stairs            Wheelchair Mobility    Modified Rankin (Stroke Patients Only)       Balance Overall balance assessment: Independent                                           Pertinent Vitals/Pain Pain Assessment: No/denies pain    Home Living Family/patient expects to be discharged to:: Private residence Living Arrangements: Children Available Help at Discharge:  Family Type of Home: House           Additional Comments: Patient is independent with mobility at baseline, lives with daughter    Prior Function Level of Independence: Independent               Hand Dominance        Extremity/Trunk Assessment   Upper Extremity Assessment Upper Extremity Assessment: Overall WFL for tasks assessed    Lower Extremity Assessment Lower Extremity Assessment: Overall WFL for tasks assessed       Communication   Communication: No difficulties  Cognition Arousal/Alertness: Awake/alert Behavior During Therapy: WFL for tasks assessed/performed Overall Cognitive Status: Within Functional Limits for tasks assessed                                        General Comments      Exercises     Assessment/Plan    PT Assessment Patent does not need any further PT services  PT Problem List         PT Treatment Interventions      PT Goals (Current goals can be found in the Care Plan section)  Acute Rehab PT Goals Patient Stated Goal: To return home PT Goal Formulation: With patient Time For Goal  Achievement: 02/03/20 Potential to Achieve Goals: Good    Frequency     Barriers to discharge        Co-evaluation               AM-PAC PT "6 Clicks" Mobility  Outcome Measure Help needed turning from your back to your side while in a flat bed without using bedrails?: None Help needed moving from lying on your back to sitting on the side of a flat bed without using bedrails?: None Help needed moving to and from a bed to a chair (including a wheelchair)?: None Help needed standing up from a chair using your arms (e.g., wheelchair or bedside chair)?: None Help needed to walk in hospital room?: None Help needed climbing 3-5 steps with a railing? : None 6 Click Score: 24    End of Session Equipment Utilized During Treatment: Gait belt Activity Tolerance: Patient tolerated treatment well Patient left: in bed;with  nursing/sitter in room Nurse Communication: Mobility status PT Visit Diagnosis: Muscle weakness (generalized) (M62.81)    Time: 3159-4585 PT Time Calculation (min) (ACUTE ONLY): 6 min   Charges:   PT Evaluation $PT Eval Low Complexity: 1 Low        Alva Garnet PT, DPT, CSCS    {01/20/2020, 1:47 PM

## 2020-01-20 NOTE — Progress Notes (Signed)
Pt starting to get confused. Had to cancel telemetry sitter and place a 1:1 sitter in with her.

## 2020-01-20 NOTE — Progress Notes (Signed)
Arrived to place patient on cpap. Patient in trendelenberg position in bed. C/o issues with back. States uses cpap at home sometimes. Asked me if I use a cpap, I said no.  Patient then stated, well when you put one on then I will put one on.  So, I just asked patient to have RN contact RT department if she changes her mind. Cpap on standby

## 2020-01-21 DIAGNOSIS — R410 Disorientation, unspecified: Secondary | ICD-10-CM | POA: Diagnosis present

## 2020-01-21 LAB — CBC
HCT: 24.3 % — ABNORMAL LOW (ref 36.0–46.0)
Hemoglobin: 8.1 g/dL — ABNORMAL LOW (ref 12.0–15.0)
MCH: 28.8 pg (ref 26.0–34.0)
MCHC: 33.3 g/dL (ref 30.0–36.0)
MCV: 86.5 fL (ref 80.0–100.0)
Platelets: 406 10*3/uL — ABNORMAL HIGH (ref 150–400)
RBC: 2.81 MIL/uL — ABNORMAL LOW (ref 3.87–5.11)
RDW: 14.7 % (ref 11.5–15.5)
WBC: 18.4 10*3/uL — ABNORMAL HIGH (ref 4.0–10.5)
nRBC: 0 % (ref 0.0–0.2)

## 2020-01-21 LAB — GLUCOSE, CAPILLARY
Glucose-Capillary: 273 mg/dL — ABNORMAL HIGH (ref 70–99)
Glucose-Capillary: 198 mg/dL — ABNORMAL HIGH (ref 70–99)
Glucose-Capillary: 269 mg/dL — ABNORMAL HIGH (ref 70–99)
Glucose-Capillary: 235 mg/dL — ABNORMAL HIGH (ref 70–99)
Glucose-Capillary: 259 mg/dL — ABNORMAL HIGH (ref 70–99)

## 2020-01-21 LAB — PROCALCITONIN: Procalcitonin: 0.99 ng/mL

## 2020-01-21 LAB — URINE CULTURE: Culture: >=100000 — AB

## 2020-01-21 MED ORDER — DULOXETINE HCL 30 MG PO CPEP
30.0000 mg | ORAL_CAPSULE | Freq: Every day | ORAL | Status: DC
  Administered 2020-01-21 – 2020-01-22 (×2): 30 mg via ORAL
  Filled 2020-01-21 (×3): qty 1

## 2020-01-21 MED ORDER — CIPROFLOXACIN HCL 500 MG PO TABS
500.0000 mg | ORAL_TABLET | Freq: Two times a day (BID) | ORAL | Status: DC
  Administered 2020-01-21 – 2020-01-23 (×4): 500 mg via ORAL
  Filled 2020-01-21 (×6): qty 1

## 2020-01-21 MED ORDER — LORAZEPAM 1 MG PO TABS
1.0000 mg | ORAL_TABLET | Freq: Four times a day (QID) | ORAL | Status: DC | PRN
  Administered 2020-01-21 – 2020-01-23 (×4): 1 mg via ORAL
  Filled 2020-01-21 (×4): qty 1

## 2020-01-21 NOTE — TOC Progression Note (Signed)
Transition of Care [facility]) - Progression Note    Patient Details  Name: April Burton MRN: [id number] Date of Birth: Aug 30, 1955  Transition of Care [facility]) CM/SW [place], RN Phone Number: 01/21/2020, 12:01 PM  Clinical Narrative:   Met with the patient in the room, she wants to leave, She said she needs to take care of her daughter, She feels that we are spying on her, She lives at home with her daughter. She reports that she was taking Saboxine but not any more, I encouraged her to let us treat her infection and blood levels. She said she has  Some Shortness of breath but refuses to allow [place] to put her in the bed for treatment,  The patient has been made IVC, the patient understands that she cant leave, The patient's sister called and said that the patient has been getting delusional and talking crazy, she stated that the patient had been taking medications not how they prescribed, The patient had been driving down the wrong side of the road, the police brought her home and parked her car, the patient had continuously been getting worse.   The patient had taken lipstick and wrote all over the wall at home, She stated that she called her daughter and tried to get her daughter to call and let the Doctor know that she had taken the pills and not the patient, her daughter refused to do so due to it being a lie.  I explained that the patient is IVC. I explained what IVC means.  I explained that they would need to  Call to speak with the Nurse before attempting to come to visit.           Expected Discharge Plan and Services                                                 Social Determinants of Health (SDOH) Interventions    Readmission Risk Interventions No flowsheet data found.

## 2020-01-21 NOTE — Progress Notes (Signed)
Pt alert but still with some confusion. Pt pulling out IV's trying to get out of bed on own on multiple occasions. 1:1 sitter for safety.

## 2020-01-21 NOTE — Consult Note (Signed)
[facility] Face-to-Face Psychiatry Consult   Reason for Consult:  Delirium, capacity Referring Physician:  Dr Caleb Popp Patient Identification: April Burton MRN:  [id number] Principal Diagnosis: Acute metabolic encephalopathy Diagnosis:  Principal Problem:   Acute metabolic encephalopathy Active Problems:   Acute delirium   UTI (urinary tract infection)   Type 2 diabetes mellitus without complication (HCC)   HTN (hypertension)   Depression   Buprenorphine dependence (HCC)   Benzodiazepine (tranquilizer) overdose, undetermined intent, initial encounter   Anemia   Total Time spent with patient: 45 minutes  Subjective:   April Burton is a 64 y.o. female patient admitted with sepsis.  Patient seen and evaluated in person.  She was told of her medical issues needing to be addressed, low hemoglobin and infection, and stated so I am bleeding yet wants to leave.  Kept saying she would tell them and when asked said them were her doctors.  She did not want to stay here as "this place spies on you and has cameras."  Confused, paranoid, and does not have capacity at this time to make medical decision.  HPI per MD:  April Burton is a 64 y.o. female with medical history significant for HTN, DM, chronic pain on Suboxone, anemia, who presented by EMS after daughter reported her being altered and eating her lip gloss.  EMS apparently went to the home twice for the day, the first time she was alert and oriented and declined transport to the hospital.  At the second visit patient was confused.  She reportedly admitted to EMS to be taking both Valium and heroin.  EMS reported that patient received prescription for 90 tablets of Valium on 01/10/2020 and now 7 days later only had 11.5 tablets left.  The Suboxone count showed appropriate usage.  History is limited due to altered mental status  Past Psychiatric History: depression  Risk to Self:  yes Risk to Others:  none Prior Inpatient Therapy:   none Prior Outpatient Therapy:  unknown  Past Medical History:  Past Medical History:  Diagnosis Date  . Depression   . Diabetes mellitus without complication (HCC)   . Eczema   . GERD (gastroesophageal reflux disease)   . Hypertension   . Seizures (HCC)     Past Surgical History:  Procedure Laterality Date  . TONSILLECTOMY    . VAGINAL WOUND CLOSURE / REPAIR     Family History:  Family History  Problem Relation Age of Onset  . CAD Father    Family Psychiatric  History: none Social History:  Social History   Substance and Sexual Activity  Alcohol Use No     Social History   Substance and Sexual Activity  Drug Use Yes   Comment: questionable heroin use    Social History   Socioeconomic History  . Marital status: Single    Spouse name: Not on file  . Number of children: Not on file  . Years of education: Not on file  . Highest education level: Not on file  Occupational History  . Not on file  Tobacco Use  . Smoking status: Never Smoker  . Smokeless tobacco: Never Used  Vaping Use  . Vaping Use: Never used  Substance and Sexual Activity  . Alcohol use: No  . Drug use: Yes    Comment: questionable heroin use  . Sexual activity: Not on file  Other Topics Concern  . Not on file  Social History Narrative  . Not on file   Social Determinants of Health  Financial Resource Strain:   . Difficulty of Paying Living Expenses:   Food Insecurity:   . Worried About Programme researcher, broadcasting/film/video in the Last Year:   . Barista in the Last Year:   Transportation Needs:   . Freight forwarder (Medical):   Marland Kitchen Lack of Transportation (Non-Medical):   Physical Activity:   . Days of Exercise per Week:   . Minutes of Exercise per Session:   Stress:   . Feeling of Stress :   Social Connections:   . Frequency of Communication with Friends and Family:   . Frequency of Social Gatherings with Friends and Family:   . Attends Religious Services:   . Active Member of  Clubs or Organizations:   . Attends Banker Meetings:   Marland Kitchen Marital Status:    Additional Social History:    Allergies:   Allergies  Allergen Reactions  . Adhesive [Tape] Rash  . Diclofenac Sodium Other (See Comments)    Caused GERD to become worse.  Marland Kitchen Neomycin-Bacitracin-Polymyxin [Bacitracin-Neomycin-Polymyxin] Rash  . Pregabalin Nausea And Vomiting    Labs:  Results for orders placed or performed during the hospital encounter of 01/18/20 (from the past 48 hour(s))  Glucose, capillary     Status: Abnormal   Collection Time: 01/19/20  8:48 PM  Result Value Ref Range   Glucose-Capillary 315 (H) 70 - 99 mg/dL    Comment: Glucose reference range applies only to samples taken after fasting for at least 8 hours.  CBC     Status: Abnormal   Collection Time: 01/20/20  4:55 AM  Result Value Ref Range   WBC 22.0 (H) 4.0 - 10.5 K/uL   RBC 3.26 (L) 3.87 - 5.11 MIL/uL   Hemoglobin 9.3 (L) 12.0 - 15.0 g/dL   HCT 16.1 (L) 36 - 46 %   MCV 89.9 80.0 - 100.0 fL   MCH 28.5 26.0 - 34.0 pg   MCHC 31.7 30.0 - 36.0 g/dL   RDW 09.6 04.5 - 40.9 %   Platelets 569 (H) 150 - 400 K/uL   nRBC 0.0 0.0 - 0.2 %    Comment: Performed at [facility], [address] [place], [place] [address]  Procalcitonin     Status: None   Collection Time: 01/20/20  4:55 AM  Result Value Ref Range   Procalcitonin 1.76 ng/mL    Comment:        Interpretation: PCT > 0.5 ng/mL and <= 2 ng/mL: Systemic infection (sepsis) is possible, but other conditions are known to elevate PCT as well. (NOTE)       Sepsis PCT Algorithm           Lower Respiratory Tract                                      Infection PCT Algorithm    ----------------------------     ----------------------------         PCT < 0.25 ng/mL                PCT < 0.10 ng/mL          Strongly encourage             Strongly discourage   discontinuation of antibiotics    initiation of antibiotics    ----------------------------      -----------------------------       PCT 0.25 -  0.50 ng/mL            PCT 0.10 - 0.25 ng/mL               OR       >80% decrease in PCT            Discourage initiation of                                            antibiotics      Encourage discontinuation           of antibiotics    ----------------------------     -----------------------------         PCT >= 0.50 ng/mL              PCT 0.26 - 0.50 ng/mL                AND       <80% decrease in PCT             Encourage initiation of                                             antibiotics       Encourage continuation           of antibiotics    ----------------------------     -----------------------------        PCT >= 0.50 ng/mL                  PCT > 0.50 ng/mL               AND         increase in PCT                  Strongly encourage                                      initiation of antibiotics    Strongly encourage escalation           of antibiotics                                     -----------------------------                                           PCT <= 0.25 ng/mL                                                 OR                                        > 80% decrease in PCT  Discontinue / Do not initiate                                             antibiotics  Performed at [facility], [address] [place], [place] [address]   Iron and TIBC     Status: Abnormal   Collection Time: 01/20/20  4:55 AM  Result Value Ref Range   Iron 21 (L) 28 - 170 ug/dL   TIBC 250 (L) 539 - 767 ug/dL   Saturation Ratios 12 10.4 - 31.8 %   UIBC 158 ug/dL    Comment: Performed at [facility], [address] [place], [place] [address]  Ferritin     Status: Abnormal   Collection Time: 01/20/20  4:55 AM  Result Value Ref Range   Ferritin 447 (H) 11 - 307 ng/mL    Comment: Performed at [facility], [address] [place], [place] [address]  Glucose,  capillary     Status: Abnormal   Collection Time: 01/20/20  7:59 AM  Result Value Ref Range   Glucose-Capillary 244 (H) 70 - 99 mg/dL    Comment: Glucose reference range applies only to samples taken after fasting for at least 8 hours.  Glucose, capillary     Status: Abnormal   Collection Time: 01/20/20 11:35 AM  Result Value Ref Range   Glucose-Capillary 293 (H) 70 - 99 mg/dL    Comment: Glucose reference range applies only to samples taken after fasting for at least 8 hours.  Glucose, capillary     Status: Abnormal   Collection Time: 01/20/20  4:41 PM  Result Value Ref Range   Glucose-Capillary 384 (H) 70 - 99 mg/dL    Comment: Glucose reference range applies only to samples taken after fasting for at least 8 hours.  Glucose, capillary     Status: Abnormal   Collection Time: 01/20/20  9:25 PM  Result Value Ref Range   Glucose-Capillary 183 (H) 70 - 99 mg/dL    Comment: Glucose reference range applies only to samples taken after fasting for at least 8 hours.  Procalcitonin     Status: None   Collection Time: 01/21/20  5:09 AM  Result Value Ref Range   Procalcitonin 0.99 ng/mL    Comment:        Interpretation: PCT > 0.5 ng/mL and <= 2 ng/mL: Systemic infection (sepsis) is possible, but other conditions are known to elevate PCT as well. (NOTE)       Sepsis PCT Algorithm           Lower Respiratory Tract                                      Infection PCT Algorithm    ----------------------------     ----------------------------         PCT < 0.25 ng/mL                PCT < 0.10 ng/mL          Strongly encourage             Strongly discourage   discontinuation of antibiotics    initiation of antibiotics    ----------------------------     -----------------------------       PCT 0.25 - 0.50  ng/mL            PCT 0.10 - 0.25 ng/mL               OR       >80% decrease in PCT            Discourage initiation of                                            antibiotics      Encourage  discontinuation           of antibiotics    ----------------------------     -----------------------------         PCT >= 0.50 ng/mL              PCT 0.26 - 0.50 ng/mL                AND       <80% decrease in PCT             Encourage initiation of                                             antibiotics       Encourage continuation           of antibiotics    ----------------------------     -----------------------------        PCT >= 0.50 ng/mL                  PCT > 0.50 ng/mL               AND         increase in PCT                  Strongly encourage                                      initiation of antibiotics    Strongly encourage escalation           of antibiotics                                     -----------------------------                                           PCT <= 0.25 ng/mL                                                 OR                                        > 80% decrease in PCT  Discontinue / Do not initiate                                             antibiotics  Performed at [facility], [address] [place], [place] [address]   CBC     Status: Abnormal   Collection Time: 01/21/20  5:09 AM  Result Value Ref Range   WBC 18.4 (H) 4.0 - 10.5 K/uL   RBC 2.81 (L) 3.87 - 5.11 MIL/uL   Hemoglobin 8.1 (L) 12.0 - 15.0 g/dL   HCT 78.6 (L) 36 - 46 %   MCV 86.5 80.0 - 100.0 fL   MCH 28.8 26.0 - 34.0 pg   MCHC 33.3 30.0 - 36.0 g/dL   RDW 76.7 20.9 - 47.0 %   Platelets 406 (H) 150 - 400 K/uL   nRBC 0.0 0.0 - 0.2 %    Comment: Performed at [facility], [address] [place], [place] [address]  Glucose, capillary     Status: Abnormal   Collection Time: 01/21/20  8:03 AM  Result Value Ref Range   Glucose-Capillary 198 (H) 70 - 99 mg/dL    Comment: Glucose reference range applies only to samples taken after fasting for at least 8 hours.  Glucose, capillary     Status: Abnormal   Collection  Time: 01/21/20 12:35 PM  Result Value Ref Range   Glucose-Capillary 269 (H) 70 - 99 mg/dL    Comment: Glucose reference range applies only to samples taken after fasting for at least 8 hours.  Glucose, capillary     Status: Abnormal   Collection Time: 01/21/20  5:11 PM  Result Value Ref Range   Glucose-Capillary 235 (H) 70 - 99 mg/dL    Comment: Glucose reference range applies only to samples taken after fasting for at least 8 hours.    Current Facility-Administered Medications  Medication Dose Route Frequency Provider Last Rate Last Admin  . acetaminophen (TYLENOL) tablet 650 mg  650 mg Oral Q6H PRN Andris Baumann, MD   650 mg at 01/21/20 1354   Or  . acetaminophen (TYLENOL) suppository 650 mg  650 mg Rectal Q6H PRN Andris Baumann, MD      . atenolol (TENORMIN) tablet 25 mg  25 mg Oral BID Narda Bonds, MD   25 mg at 01/21/20 0814  . atorvastatin (LIPITOR) tablet 40 mg  40 mg Oral QHS Narda Bonds, MD   40 mg at 01/20/20 2135  . ciprofloxacin (CIPRO) tablet 500 mg  500 mg Oral BID Narda Bonds, MD   500 mg at 01/21/20 1747  . DULoxetine (CYMBALTA) DR capsule 30 mg  30 mg Oral Daily Charm Rings, NP   30 mg at 01/21/20 1354  . enoxaparin (LOVENOX) injection 40 mg  40 mg Subcutaneous Q24H Andris Baumann, MD   40 mg at 01/21/20 0814  . famotidine (PEPCID) tablet 40 mg  40 mg Oral QHS Narda Bonds, MD   40 mg at 01/20/20 2135  . insulin aspart (novoLOG) injection 0-15 Units  0-15 Units Subcutaneous TID WC Andris Baumann, MD   5 Units at 01/21/20 1747  . insulin aspart (novoLOG) injection 0-5 Units  0-5 Units Subcutaneous QHS Andris Baumann, MD   4 Units at 01/19/20 2105  . insulin glargine (LANTUS) injection 40 Units  40 Units Subcutaneous  Daily Narda BondsNettey, Ralph A, MD   40 Units at 01/21/20 1003  . LORazepam (ATIVAN) tablet 1 mg  1 mg Oral Q6H PRN Charm RingsLord, Omere Marti Y, NP   1 mg at 01/21/20 1241  . ondansetron (ZOFRAN) tablet 4 mg  4 mg Oral Q6H PRN Andris Baumannuncan, Hazel V, MD       Or   . ondansetron [facility](ZOFRAN) injection 4 mg  4 mg Intravenous Q6H PRN Andris Baumannuncan, Hazel V, MD      . pantoprazole (PROTONIX) EC tablet 40 mg  40 mg Oral Daily Narda BondsNettey, Ralph A, MD   40 mg at 01/21/20 1747    Musculoskeletal: Strength & Muscle Tone: decreased Gait & Station: did not witness Patient leans: N/A  Psychiatric Specialty Exam: Physical Exam Vitals and nursing note reviewed.  Constitutional:      Appearance: Normal appearance.  HENT:     Head: Normocephalic.     Nose: Nose normal.  Pulmonary:     Effort: Pulmonary effort is normal.  Musculoskeletal:     Cervical back: Normal range of motion.  Neurological:     General: No focal deficit present.     Mental Status: She is alert.  Psychiatric:        Attention and Perception: She is inattentive.        Mood and Affect: Mood is anxious.        Speech: Speech normal.        Behavior: Behavior is uncooperative.        Thought Content: Thought content is paranoid.        Cognition and Memory: Cognition is impaired. Memory is impaired.        Judgment: Judgment is inappropriate.     Review of Systems  Psychiatric/Behavioral: Positive for confusion. The patient is nervous/anxious.   All other systems reviewed and are negative.   Blood pressure (!) 131/57, pulse 67, temperature 97.7 F (36.5 C), temperature source Oral, resp. rate 21, height 5\' 8"  (1.727 m), weight (!) 102.1 kg, SpO2 100 %.Body mass index is 34.21 kg/m.  General Appearance: Casual  Eye Contact:  Fair  Speech:  Normal Rate  Volume:  Increased  Mood:  Anxious and Irritable  Affect:  Blunt  Thought Process:  Irrelevant at times  Orientation:  Other:  person and place  Thought Content:  Paranoid Ideation  Suicidal Thoughts:  No  Homicidal Thoughts:  No  Memory:  Immediate;   Poor Recent;   Poor Remote;   Poor  Judgement:  Impaired  Insight:  Lacking  Psychomotor Activity:  Normal  Concentration:  Concentration: Poor and Attention Span: Poor  Recall:  Poor   Fund of Knowledge:  Fair  Language:  Fair  Akathisia:  No  Handed:  Right  AIMS (if indicated):     Assets:  Housing Leisure Time Resilience Social Support  ADL's:  Impaired  Cognition:  Impaired,  Moderate  Sleep:        Treatment Plan Summary: Patient does not have capacity to make medical decisions at this time.  Please monitor for benzodiazepine withdrawal - CIWA in place. Delirium: Patient likely has ongoing delirium which presents as fluctuating cognition.  At time of this assessment patient has full capacity.  The patient's exam is notable for altered sensorium, perceptual disturbances, disorientation and cognitive deficits that appear markedly different than their baseline, suggesting a diagnosis of delirium.  Virtually any medical condition or physiologic stress can precipitate delirium in a susceptible individual, with risk increasing in those  with: advanced age, sensory impairments, organic brain disease (stroke, dementia, Parkinsons), psychiatric illness, major chronic medical issues, prolonged hospitalizations, postoperative status, anemia, insomnia/disturbed sleep, and severe pain. Addressing the underlying medical condition and institution of preventative measures are recommended.  - Continue to monitor and treat underlying medical causes of delirium, including infection, electrolyte disturbances, etc. - Delirium precautions - Minimize/avoid deliriogenic meds including: anticholinergic, opiates, benzodiazepines           - Maintain hydration, oxygenation, nutrition           - Limit use of restraints and catheters           - Normalize sleep patterns by minimizing nighttime noise, light and interruptions by     -Ensure sleep apnea treatment is provided overnight.             clustering care, opening blinds during the day           - Reorient the patient frequently, provide easily visible clock and calendar           - Provide sensory aids like glasses, hearing aids            - Encourage ambulation, regular activities and visitors to maintain cognitive stimulation   -Patient would benefit from having family members at bedside to reinforce his orientation.  Disposition: No evidence of imminent risk to self or others at present.   Patient does not meet criteria for psychiatric inpatient admission. Supportive therapy provided about ongoing stressors.  Nanine Means, NP 01/21/2020 6:19 PM

## 2020-01-21 NOTE — Progress Notes (Addendum)
PROGRESS NOTE    April Burton  CXK:[id number] DOB: [birth year] DOA: 01/18/2020 PCP: System, Pcp Not In   Brief Narrative: April Burton is a 64 y.o. female with medical history significant for HTN, DM, chronic pain on Suboxone, anemia, depression. Patient presented secondary to altered mental status in setting of UTI and sepsis, present on admission. Patient started empirically on ceftriaxone and blood cultures obtained. No urine culture obtained on admission.   Assessment & Plan:   Principal Problem:   Acute metabolic encephalopathy Active Problems:   UTI (urinary tract infection)   Type 2 diabetes mellitus without complication (HCC)   HTN (hypertension)   Depression   Buprenorphine dependence (HCC)   Benzodiazepine (tranquilizer) overdose, undetermined intent, initial encounter   Anemia   Acute metabolic encephalopathy Present on admission. Patient has some confusion. Some of this appear to be behavioral, however. Resolved. -Treat infection below  UTI Pyelonephritis Urinalysis is highly suggestive of infection. Since patient incurred high fevers, will treat as pyelonephritis. Blood cultures are no growth to date. Urine culture significant for Enterobacter hormaechei; sensitivities available -Switch to ciprofloxacin 500 mg PO BID  -Follow up blood and urine cultures (urine culture added on to admission urine collection) -PT/OT recommendations pending  Sepsis Present on admission with fever and leukocytosis. Secondary to UTI. No lactic acidosis. No hypotension. Leukocytosis still significantly elevated but trended down slightly. -Antibiotics as mentioned above  Paranoia Lack of capacity Patient attempted to leave the hospital against medical advice. On my evaluation/assessment, I do not think she has capacity as she could not explain the risks/benefits of her decisions in addition to understanding the reason for continued need to be inpatient. Psychiatry consulted  and patient is currently under IVC (01/21/2020) -Psychiatry recommendations  Benzodiazepine overdose Per report, this appears to be unintentional. Patient confused and taking more pills than necessary. No symptoms of overdose currently.  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 Patient plans on discontinuing this after discussion with her primary physician.  Essential hypertension Patient is on atenolol, enalapril, hydrochlorothiazide, spironolactone as an outpatient. BP was soft on admission. Patient appears to have some rebound tachycardia vs tachycardia from infection. -Restart home atenolol and hold other antihypertensives  Diabetes mellitus, type 2 Patient is on Lantus 65 units BID, Humalog 35 units TID with meals, metformin and Ozempic as an outpatient. Last hemoglobin A1C of 8.6 (01/19/2020). Started on SSI inpatient. Blood sugar uncontrolled today.  -Continue SSI -Continue Lantus 40 units daily  Depression Patient is on Cymbalta 60 mg BID which is a very high dose. Will need to clarify that she is taking this medication. She is also on a high dose of gabapentin at 900 mg TID.  Hyperlipidemia -Continue Lipitor  Anemia Unknown if this is new or chronic. Last hemoglobin available is from 2019. No evidence of bleeding. Iron panel suggests chronic disease picture as anticipated. Will hold off on iron supplementation for now. Anemia is stable.  GERD -Continue Nexium (Protonix while inpatient) and Pepcid   DVT prophylaxis: Lovenox Code Status:   Code Status: Full Code Family Communication: None at bedside Disposition Plan: Discharge in several days pending psychiatry recommendations, continued management of infection   Consultants:   Psychiatry  Procedures:   None  Antimicrobials:  Ceftriaxone  Ciprofloxacin   Subjective: Patient is adamant about leaving the hospital to see her daughter. She states that she will get antibiotics from her primary  care physician. When I told her I would check on a few things and get  back to her, she stated that she would leave and that I could get back to her tomorrow.   Objective: Vitals:   01/21/20 0602 01/21/20 0801 01/21/20 1200 01/21/20 1232  BP:  (!) 140/57 (!) 144/54 (!) 144/54  Pulse:  82 82 81  Resp:  16  21  Temp: 99.4 F (37.4 C) 99.7 F (37.6 C)  99.3 F (37.4 C)  TempSrc: Oral Oral  Oral  SpO2:  100%  97%  Weight:      Height:        Intake/Output Summary (Last 24 hours) at 01/21/2020 1533 Last data filed at 01/21/2020 0601 Gross per 24 hour  Intake 1880.06 ml  Output 2100 ml  Net -219.94 ml   Filed Weights   01/18/20 2145  Weight: (!) 102.1 kg    Examination:  General exam: Appears calm and comfortable Respiratory system: Clear to auscultation. Respiratory effort normal. Cardiovascular system: S1 & S2 heard, RRR. No murmurs, rubs, gallops or clicks. Gastrointestinal system: Abdomen is nondistended, soft and nontender. No organomegaly or masses felt. Normal bowel sounds heard. Central nervous system: Alert and oriented. No focal neurological deficits. Musculoskeletal: No edema. No calf tenderness Skin: No cyanosis. No rashes Psychiatry: Judgement and insight appear impaired. Paranoid thoughts.     Data Reviewed: I have personally reviewed following labs and imaging studies  CBC Lab Results  Component Value Date   WBC 18.4 (H) 01/21/2020   RBC 2.81 (L) 01/21/2020   HGB 8.1 (L) 01/21/2020   HCT 24.3 (L) 01/21/2020   MCV 86.5 01/21/2020   MCH 28.8 01/21/2020   PLT 406 (H) 01/21/2020   MCHC 33.3 01/21/2020   RDW 14.7 01/21/2020   LYMPHSABS 0.9 01/18/2020   MONOABS 1.2 (H) 01/18/2020   EOSABS 0.0 01/18/2020   BASOSABS 0.1 01/18/2020     Last metabolic panel Lab Results  Component Value Date   NA 135 01/19/2020   K 4.3 01/19/2020   CL 98 01/19/2020   CO2 29 01/19/2020   BUN 25 (H) 01/19/2020   CREATININE 0.84 01/19/2020   GLUCOSE 276 (H) 01/19/2020     GFRNONAA >60 01/19/2020   GFRAA >60 01/19/2020   CALCIUM 9.1 01/19/2020   PROT 6.6 01/18/2020   ALBUMIN 2.8 (L) 01/18/2020   BILITOT 1.5 (H) 01/18/2020   ALKPHOS 88 01/18/2020   AST 18 01/18/2020   ALT 20 01/18/2020   ANIONGAP 8 01/19/2020    CBG (last 3)  Recent Labs    01/20/20 2125 01/21/20 0803 01/21/20 1235  GLUCAP 183* 198* 269*     GFR: Estimated Creatinine Clearance: 84.6 mL/min (by C-G formula based on SCr of 0.84 mg/dL).  Coagulation Profile: Recent Labs  Lab 01/19/20 0247  INR 1.2    Recent Results (from the past 240 hour(s))  Blood Culture (routine x 2)     Status: None (Preliminary result)   Collection Time: 01/18/20  9:58 PM   Specimen: BLOOD  Result Value Ref Range Status   Specimen Description BLOOD BLOOD LEFT FOREARM  Final   Special Requests   Final    BOTTLES DRAWN AEROBIC AND ANAEROBIC Blood Culture results may not be optimal due to an excessive volume of blood received in culture bottles   Culture   Final    NO GROWTH 3 DAYS Performed at [facility], [address] [place], [place] [address]    Report Status PENDING  Incomplete  Blood Culture (routine x 2)     Status: None (Preliminary result)  Collection Time: 01/18/20 10:08 PM   Specimen: BLOOD  Result Value Ref Range Status   Specimen Description BLOOD BLOOD RIGHT FOREARM  Final   Special Requests   Final    BOTTLES DRAWN AEROBIC AND ANAEROBIC Blood Culture adequate volume   Culture   Final    NO GROWTH 3 DAYS Performed at [facility], [address] [place], [place] [address]    Report Status PENDING  Incomplete  Urine Culture     Status: Abnormal   Collection Time: 01/18/20 10:40 PM   Specimen: Urine, Clean Catch  Result Value Ref Range Status   Specimen Description   Final    URINE, CLEAN CATCH Performed at [facility], [address] [place], [place] [address]    Special Requests   Final    NONE Performed at [facility],  [address] [place], [place] [address]    Culture >=100,000 COLONIES/mL ENTEROBACTER CLOACAE (A)  Final   Report Status 01/21/2020 FINAL  Final   Organism ID, Bacteria ENTEROBACTER CLOACAE (A)  Final      Susceptibility   Enterobacter cloacae - MIC*    CEFAZOLIN >=64 RESISTANT Resistant     CIPROFLOXACIN <=0.25 SENSITIVE Sensitive     GENTAMICIN <=1 SENSITIVE Sensitive     IMIPENEM <=0.25 SENSITIVE Sensitive     NITROFURANTOIN 32 SENSITIVE Sensitive     TRIMETH/SULFA <=20 SENSITIVE Sensitive     PIP/TAZO <=4 SENSITIVE Sensitive     * >=100,000 COLONIES/mL ENTEROBACTER CLOACAE  SARS Coronavirus 2 by RT PCR (hospital order, performed in [facility] hospital lab) Nasopharyngeal Nasopharyngeal Swab     Status: None   Collection Time: 01/18/20 11:35 PM   Specimen: Nasopharyngeal Swab  Result Value Ref Range Status   SARS Coronavirus 2 NEGATIVE NEGATIVE Final    Comment: (NOTE) SARS-CoV-2 target nucleic acids are NOT DETECTED.  The SARS-CoV-2 RNA is generally detectable in upper and lower respiratory specimens during the acute phase of infection. The lowest concentration of SARS-CoV-2 viral copies this assay can detect is 250 copies / mL. A negative result does not preclude SARS-CoV-2 infection and should not be used as the sole basis for treatment or other patient management decisions.  A negative result may occur with improper specimen collection / handling, submission of specimen other than nasopharyngeal swab, presence of viral mutation(s) within the areas targeted by this assay, and inadequate number of viral copies (<250 copies / mL). A negative result must be combined with clinical observations, patient history, and epidemiological information.  Fact Sheet for Patients:   BoilerBrush.com.cyhttps://www.fda.gov/media/136312/download  Fact Sheet for Healthcare Providers: https://pope.com/https://www.fda.gov/media/136313/download  This test is not yet approved or  cleared by the [place] FDA and has  been authorized for detection and/or diagnosis of SARS-CoV-2 by FDA under an Emergency Use Authorization (EUA).  This EUA will remain in effect (meaning this test can be used) for the duration of the COVID-19 declaration under Section 564(b)(1) of the Act, 21 U.S.C. section 360bbb-3(b)(1), unless the authorization is terminated or revoked sooner.  Performed at [facility], [address] [place], [place] [address]         Radiology Studies: No results found.      Scheduled Meds: . atenolol  25 mg Oral BID  . atorvastatin  40 mg Oral QHS  . DULoxetine  30 mg Oral Daily  . enoxaparin (LOVENOX) injection  40 mg Subcutaneous Q24H  . famotidine  40 mg Oral QHS  . insulin aspart  0-15 Units Subcutaneous TID WC  . insulin aspart  0-5 Units Subcutaneous QHS  . insulin glargine  40 Units Subcutaneous Daily  . pantoprazole  40 mg Oral Daily   Continuous Infusions: . sodium chloride 100 mL/hr at 01/21/20 0007  . cefTRIAXone (ROCEPHIN)  IV Stopped (01/20/20 1758)     LOS: 2 days     Jacquelin Hawking, MD [facility] 01/21/2020, 3:33 PM  If 7PM-7AM, please contact night-coverage [email or web address]

## 2020-01-21 NOTE — Evaluation (Signed)
Occupational Therapy Evaluation Patient Details Name: April Burton MRN: [id number] DOB: [birth year] Today's Date: 01/21/2020    History of Present Illness Patient is a 64 y/o F that presents with confusion/AMS, UTI, and sepsis. She had an apparent overdose accidentally it appears per medical records.   Clinical Impression   Pt was seen for OT evaluation this date. Prior to hospital admission, pt was independent and helping to care for daughter who lives with her. Unclear how much assist dtr needs (appears more emotional assist vs physical). Pt eager to return home and expresses frustration towards staff and feeling "observed" all the time (pt currently with 1:1 sitter orders). Emotional support and active listening provided. Currently pt able to perform ADL independently and pt endorses feeling at baseline functionally. Ambulating without assist in room. Preparing to go to bathroom with nurse tech for shower, requiring set up only. Pt demonstrated increased processing time to recall she is here due to "sick from a UTI" but otherwise oriented and able to follow commands well. Anxious to return home to daughter. Do not currently anticipate additional skilled OT needs at this time. Will sign off. Please re-consult if additional acute needs arise. Recommend intermittent supervision for safety upon return home.     Follow Up Recommendations  No OT follow up;Supervision - Intermittent    Equipment Recommendations  None recommended by OT    Recommendations for Other Services       Precautions / Restrictions Precautions Precautions: Fall Restrictions Weight Bearing Restrictions: No      Mobility Bed Mobility               General bed mobility comments: deferred, up in recliner  Transfers Overall transfer level: Independent               General transfer comment: STS without difficulty    Balance Overall balance assessment: Mild deficits observed, not formally tested                                          ADL either performed or assessed with clinical judgement   ADL Overall ADL's : At baseline                                       General ADL Comments: indep with dressing, toilet transfers, and preparing for set up only bath in bathroom with nurse tech     Vision Baseline Vision/History: Wears glasses Wears Glasses: Distance only (pt reports she is supposed to wear glasses for driving, but unable to afford at this time) Patient Visual Report: No change from baseline Vision Assessment?: No apparent visual deficits     Perception     Praxis      Pertinent Vitals/Pain Pain Assessment: No/denies pain     Hand Dominance Right   Extremity/Trunk Assessment Upper Extremity Assessment Upper Extremity Assessment: Overall WFL for tasks assessed   Lower Extremity Assessment Lower Extremity Assessment: Overall WFL for tasks assessed       Communication Communication Communication: No difficulties   Cognition Arousal/Alertness: Awake/alert Behavior During Therapy: WFL for tasks assessed/performed Overall Cognitive Status: No family/caregiver present to determine baseline cognitive functioning  General Comments: Pt alert and oriented x4, required additional time to describe why she was at the hospital but was eventually able to indicate she was sick from a UTI. Follows commands, anxious to get home to care for dtr (although reports dtr does not need assist but just needs encouragement)   General Comments       Exercises     Shoulder Instructions      Home Living Family/patient expects to be discharged to:: Private residence Living Arrangements: Children (daughter) Available Help at Discharge: Family Type of Home: House       Home Layout: One level     Bathroom Shower/Tub: Chief Strategy Officer: Standard     Home Equipment: Environmental consultant - 2  wheels;Bedside commode   Additional Comments: Patient is independent with mobility at baseline, lives with daughter      Prior Functioning/Environment Level of Independence: Independent        Comments: Per pt, indep with mobility, driving, and ADL/IADL. She cares for dtr but unable to specifically note what she needs assist with. Per pt, dtr does most cooking/cleaning and pt indep with ADL/IADL, including med mgt.        OT Problem List: Decreased cognition;Decreased safety awareness      OT Treatment/Interventions:      OT Goals(Current goals can be found in the care plan section) Acute Rehab OT Goals Patient Stated Goal: To return home this morning OT Goal Formulation: All assessment and education complete, DC therapy  OT Frequency:     Barriers to D/C:            Co-evaluation              AM-PAC OT "6 Clicks" Daily Activity     Outcome Measure Help from another person eating meals?: None Help from another person taking care of personal grooming?: None Help from another person toileting, which includes using toliet, bedpan, or urinal?: None Help from another person bathing (including washing, rinsing, drying)?: None Help from another person to put on and taking off regular upper body clothing?: None Help from another person to put on and taking off regular lower body clothing?: None 6 Click Score: 24   End of Session    Activity Tolerance: Patient tolerated treatment well Patient left: with nursing/sitter in room (ambulating to bathroom with nurse tech preparing for bath)  OT Visit Diagnosis: Other abnormalities of gait and mobility (R26.89)                Time: 1610-9604 OT Time Calculation (min): 22 min Charges:  OT General Charges $OT Visit: 1 Visit OT Evaluation $OT Eval Low Complexity: 1 Low  Richrd Prime, MPH, MS, OTR/L ascom [phone] 01/21/20, 9:45 AM

## 2020-01-21 NOTE — Progress Notes (Signed)
PT demanded for IV cath to be removed. Completed IV cath removed. IV intact upon removal.

## 2020-01-21 NOTE — Progress Notes (Signed)
PT demanding for IV cath to be removed. Completed IV cath removed. IV intact upon removal.

## 2020-01-21 NOTE — Progress Notes (Signed)
Inpatient Diabetes Program Recommendations  AACE/ADA: New Consensus Statement on Inpatient Glycemic Control   Target Ranges:  Prepandial:   less than 140 mg/dL      Peak postprandial:   less than 180 mg/dL (1-2 hours)      Critically ill patients:  140 - 180 mg/dL   Results for April Burton, April Burton (MRN [id number]) as of 01/21/2020 10:42  Ref. Range 01/20/2020 07:59 01/20/2020 11:35 01/20/2020 16:41 01/20/2020 21:25 01/21/2020 08:03  Glucose-Capillary Latest Ref Range: 70 - 99 mg/dL 224 (H) 497 (H) 530 (H) 183 (H) 198 (H)   Review of Glycemic Control  Diabetes history: DM2 Outpatient Diabetes medications: Lantus 65 units BID, Humalog 35 units TID with meals, Metformin XR 1000 mg BID, Ozempic 1mg  Qweek Current orders for Inpatient glycemic control: Lantus 40 units daily, Novolog 0-15 units TID with meals, Novolog 0-5 units QHS  Inpatient Diabetes Program Recommendations:    Insulin-Meal Coverage: If post prandial glucose is consistently over 180 mg/dl, please consider ordering Novolog 5 units TID with meals for meal coverage if patient eats at least 50% of meals.  Thanks, , RN, MSN, CDE Diabetes Coordinator Inpatient Diabetes Program [phone] (Team Pager from 8am to 5pm)

## 2020-01-22 DIAGNOSIS — F1120 Opioid dependence, uncomplicated: Secondary | ICD-10-CM

## 2020-01-22 LAB — CBC WITH DIFFERENTIAL/PLATELET
Abs Immature Granulocytes: 0.17 10*3/uL — ABNORMAL HIGH (ref 0.00–0.07)
Basophils Absolute: 0.1 10*3/uL (ref 0.0–0.1)
Basophils Relative: 0 %
Eosinophils Absolute: 0.1 10*3/uL (ref 0.0–0.5)
Eosinophils Relative: 0 %
HCT: 25.3 % — ABNORMAL LOW (ref 36.0–46.0)
Hemoglobin: 8.1 g/dL — ABNORMAL LOW (ref 12.0–15.0)
Immature Granulocytes: 1 %
Lymphocytes Relative: 11 %
Lymphs Abs: 2 10*3/uL (ref 0.7–4.0)
MCH: 28.5 pg (ref 26.0–34.0)
MCHC: 32 g/dL (ref 30.0–36.0)
MCV: 89.1 fL (ref 80.0–100.0)
Monocytes Absolute: 1.2 10*3/uL — ABNORMAL HIGH (ref 0.1–1.0)
Monocytes Relative: 7 %
Neutro Abs: 14.9 10*3/uL — ABNORMAL HIGH (ref 1.7–7.7)
Neutrophils Relative %: 81 %
Platelets: 440 10*3/uL — ABNORMAL HIGH (ref 150–400)
RBC: 2.84 MIL/uL — ABNORMAL LOW (ref 3.87–5.11)
RDW: 14.8 % (ref 11.5–15.5)
WBC: 18.3 10*3/uL — ABNORMAL HIGH (ref 4.0–10.5)
nRBC: 0 % (ref 0.0–0.2)

## 2020-01-22 LAB — GLUCOSE, CAPILLARY
Glucose-Capillary: 236 mg/dL — ABNORMAL HIGH (ref 70–99)
Glucose-Capillary: 280 mg/dL — ABNORMAL HIGH (ref 70–99)
Glucose-Capillary: 316 mg/dL — ABNORMAL HIGH (ref 70–99)
Glucose-Capillary: 216 mg/dL — ABNORMAL HIGH (ref 70–99)

## 2020-01-22 LAB — CBC
HCT: 24.8 % — ABNORMAL LOW (ref 36.0–46.0)
Hemoglobin: 8.2 g/dL — ABNORMAL LOW (ref 12.0–15.0)
MCH: 28.6 pg (ref 26.0–34.0)
MCHC: 33.1 g/dL (ref 30.0–36.0)
MCV: 86.4 fL (ref 80.0–100.0)
Platelets: 428 10*3/uL — ABNORMAL HIGH (ref 150–400)
RBC: 2.87 MIL/uL — ABNORMAL LOW (ref 3.87–5.11)
RDW: 14.9 % (ref 11.5–15.5)
WBC: 18.4 10*3/uL — ABNORMAL HIGH (ref 4.0–10.5)
nRBC: 0 % (ref 0.0–0.2)

## 2020-01-22 LAB — POTASSIUM: Potassium: 3.7 mmol/L (ref 3.5–5.1)

## 2020-01-22 LAB — MAGNESIUM: Magnesium: 1.3 mg/dL — ABNORMAL LOW (ref 1.7–2.4)

## 2020-01-22 MED ORDER — GABAPENTIN 300 MG PO CAPS
900.0000 mg | ORAL_CAPSULE | Freq: Every day | ORAL | Status: DC
  Administered 2020-01-22: 900 mg via ORAL
  Filled 2020-01-22: qty 3

## 2020-01-22 MED ORDER — MAGNESIUM SULFATE 2 GM/50ML IV SOLN
2.0000 g | Freq: Once | INTRAVENOUS | Status: AC
  Administered 2020-01-22: 2 g via INTRAVENOUS
  Filled 2020-01-22: qty 50

## 2020-01-22 MED ORDER — INSULIN GLARGINE 100 UNIT/ML ~~LOC~~ SOLN
50.0000 [IU] | Freq: Every day | SUBCUTANEOUS | Status: DC
  Administered 2020-01-23: 50 [IU] via SUBCUTANEOUS
  Filled 2020-01-22 (×2): qty 0.5

## 2020-01-22 MED ORDER — CLONAZEPAM 0.25 MG PO TBDP
1.0000 mg | ORAL_TABLET | Freq: Two times a day (BID) | ORAL | Status: DC
  Administered 2020-01-22 – 2020-01-23 (×3): 1 mg via ORAL
  Filled 2020-01-22 (×3): qty 4

## 2020-01-22 MED ORDER — DULOXETINE HCL 60 MG PO CPEP
60.0000 mg | ORAL_CAPSULE | Freq: Every day | ORAL | Status: DC
  Administered 2020-01-23: 60 mg via ORAL
  Filled 2020-01-22: qty 1

## 2020-01-22 MED ORDER — INSULIN ASPART 100 UNIT/ML ~~LOC~~ SOLN
10.0000 [IU] | Freq: Three times a day (TID) | SUBCUTANEOUS | Status: DC
  Administered 2020-01-22 – 2020-01-23 (×3): 10 [IU] via SUBCUTANEOUS
  Filled 2020-01-22 (×3): qty 1

## 2020-01-22 NOTE — Discharge Instructions (Addendum)
Patient will follow-up with her psychiatrist and PCP in 1 to 2 weeks

## 2020-01-22 NOTE — Progress Notes (Signed)
PROGRESS NOTE    April Burton  VEL:[id number] DOB: May 09, 1956 DOA: 01/18/2020 PCP: System, Pcp Not In   Brief Narrative: April Burton is a 64 y.o. female with medical history significant for HTN, DM, chronic pain on Suboxone, anemia, depression. Patient presented secondary to altered mental status in setting of UTI and sepsis, present on admission. Patient started empirically on ceftriaxone and blood cultures obtained. No urine culture obtained on admission.   Assessment & Plan:   Principal Problem:   Acute metabolic encephalopathy Active Problems:   UTI (urinary tract infection)   Type 2 diabetes mellitus without complication (HCC)   HTN (hypertension)   Depression   Buprenorphine dependence (HCC)   Benzodiazepine (tranquilizer) overdose, undetermined intent, initial encounter   Anemia   Acute delirium   Acute metabolic encephalopathy Present on admission. Patient has some confusion. Some of this appear to be behavioral, however. Resolved. -Treat infection below  UTI Pyelonephritis Urinalysis is highly suggestive of infection. Since patient incurred high fevers, will treat as pyelonephritis. Blood cultures are no growth to date. Urine culture significant for Enterobacter hormaechei; sensitivities available -Continue ciprofloxacin 500 mg PO BID; will treat for 10-14 days -Follow up blood and urine cultures (urine culture added on to admission urine collection) -PT/OT recommendations: No follow-up  Leukocytosis Slight improvement but seems to have stabilized and is still elevated. No flank pain. No evidence of abscess on CT scan. No recurrent fevers and procalcitonin is trended down. Possible leukocytosis may be lagging behind -Add differential -CBC in AM  Sepsis Present on admission with fever and leukocytosis. Secondary to UTI. No lactic acidosis. No hypotension. Leukocytosis still significantly elevated but trended down slightly. -Antibiotics as mentioned  above  Paranoia Lack of capacity Patient attempted to leave the hospital against medical advice. On my evaluation/assessment, I do not think she has capacity as she could not explain the risks/benefits of her decisions in addition to understanding the reason for continued need to be inpatient. Psychiatry consulted and patient is currently under IVC (01/21/2020) -Psychiatry recommendations: pending today  Benzodiazepine overdose Per report, this appears to be unintentional. Patient confused and taking more pills than necessary. No symptoms of overdose currently.  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 Patient plans on discontinuing this after discussion with her primary physician.  Essential hypertension Patient is on atenolol, enalapril, hydrochlorothiazide, spironolactone as an outpatient. BP was soft on admission. Patient appears to have some rebound tachycardia vs tachycardia from infection. -Continue atenolol  Diabetes mellitus, type 2 Patient is on Lantus 65 units BID, Humalog 35 units TID with meals, metformin and Ozempic as an outpatient. Last hemoglobin A1C of 8.6 (01/19/2020). Started on SSI inpatient. Blood sugar uncontrolled today.  -Continue SSI -Increase to Lantus 50 units daily and add Novolog 10 units TID with meals  Depression Patient is on Cymbalta 60 mg BID which is a very high dose. Will need to clarify that she is taking this medication. She is also on a high dose of gabapentin at 900 mg TID. -Per Psychiatry  Hyperlipidemia -Continue Lipitor  Anemia Unknown if this is new or chronic. Last hemoglobin available is from 2019. No evidence of bleeding. Iron panel suggests chronic disease picture as anticipated. Will hold off on iron supplementation for now. Anemia is stable.  GERD -Continue Nexium (Protonix while inpatient) and Pepcid   DVT prophylaxis: Lovenox Code Status:   Code Status: Full Code Family Communication: None at  bedside Disposition Plan: Discharge in several days pending psychiatry recommendations, continued management  of infection   Consultants:   Psychiatry  Procedures:   None  Antimicrobials:  Ceftriaxone  Ciprofloxacin   Subjective: No concerns today. No issues overnight.  Objective: Vitals:   01/21/20 1947 01/22/20 0036 01/22/20 0350 01/22/20 0858  BP: (!) 136/59 (!) 132/63 (!) 142/57 (!) 119/58  Pulse: 82 79 78 79  Resp: 19 17 16 17   Temp: 97.6 F (36.4 C) 98.1 F (36.7 C) 98.1 F (36.7 C)   TempSrc: Oral Oral Oral   SpO2: 100% 99% 92% 99%  Weight:  (!) 106.1 kg    Height:        Intake/Output Summary (Last 24 hours) at 01/22/2020 1413 Last data filed at 01/22/2020 1407 Gross per 24 hour  Intake 2181.98 ml  Output --  Net 2181.98 ml   Filed Weights   01/18/20 2145 01/22/20 0036  Weight: (!) 102.1 kg (!) 106.1 kg    Examination:  General exam: Appears calm and comfortable Respiratory system: Clear to auscultation. Respiratory effort normal. Cardiovascular system: S1 & S2 heard, RRR. No murmurs, rubs, gallops or clicks. Gastrointestinal system: Abdomen is nondistended, soft and nontender. No organomegaly or masses felt. Normal bowel sounds heard. Central nervous system: Alert and oriented. No focal neurological deficits. Musculoskeletal: No calf tenderness Skin: No cyanosis. No rashes Psychiatry: Judgement and insight appear impaired. Mood & affect appropriate.     Data Reviewed: I have personally reviewed following labs and imaging studies  CBC Lab Results  Component Value Date   WBC 18.4 (H) 01/22/2020   WBC 18.3 (H) 01/22/2020   RBC 2.87 (L) 01/22/2020   RBC 2.84 (L) 01/22/2020   HGB 8.2 (L) 01/22/2020   HGB 8.1 (L) 01/22/2020   HCT 24.8 (L) 01/22/2020   HCT 25.3 (L) 01/22/2020   MCV 86.4 01/22/2020   MCV 89.1 01/22/2020   MCH 28.6 01/22/2020   MCH 28.5 01/22/2020   PLT 428 (H) 01/22/2020   PLT 440 (H) 01/22/2020   MCHC 33.1 01/22/2020    MCHC 32.0 01/22/2020   RDW 14.9 01/22/2020   RDW 14.8 01/22/2020   LYMPHSABS 2.0 01/22/2020   MONOABS 1.2 (H) 01/22/2020   EOSABS 0.1 01/22/2020   BASOSABS 0.1 01/22/2020     Last metabolic panel Lab Results  Component Value Date   NA 135 01/19/2020   K 4.3 01/19/2020   CL 98 01/19/2020   CO2 29 01/19/2020   BUN 25 (H) 01/19/2020   CREATININE 0.84 01/19/2020   GLUCOSE 276 (H) 01/19/2020   GFRNONAA >60 01/19/2020   GFRAA >60 01/19/2020   CALCIUM 9.1 01/19/2020   PROT 6.6 01/18/2020   ALBUMIN 2.8 (L) 01/18/2020   BILITOT 1.5 (H) 01/18/2020   ALKPHOS 88 01/18/2020   AST 18 01/18/2020   ALT 20 01/18/2020   ANIONGAP 8 01/19/2020    CBG (last 3)  Recent Labs    01/21/20 2231 01/22/20 0759 01/22/20 1228  GLUCAP 259* 236* 280*     GFR: Estimated Creatinine Clearance: 86.3 mL/min (by C-G formula based on SCr of 0.84 mg/dL).  Coagulation Profile: Recent Labs  Lab 01/19/20 0247  INR 1.2    Recent Results (from the past 240 hour(s))  Blood Culture (routine x 2)     Status: None (Preliminary result)   Collection Time: 01/18/20  9:58 PM   Specimen: BLOOD  Result Value Ref Range Status   Specimen Description BLOOD BLOOD LEFT FOREARM  Final   Special Requests   Final    BOTTLES DRAWN AEROBIC AND ANAEROBIC  Blood Culture results may not be optimal due to an excessive volume of blood received in culture bottles   Culture   Final    NO GROWTH 4 DAYS Performed at [facility], [address] [place], [place] [address]    Report Status PENDING  Incomplete  Blood Culture (routine x 2)     Status: None (Preliminary result)   Collection Time: 01/18/20 10:08 PM   Specimen: BLOOD  Result Value Ref Range Status   Specimen Description BLOOD BLOOD RIGHT FOREARM  Final   Special Requests   Final    BOTTLES DRAWN AEROBIC AND ANAEROBIC Blood Culture adequate volume   Culture   Final    NO GROWTH 4 DAYS Performed at [facility], [address]  [place], [place] [address]    Report Status PENDING  Incomplete  Urine Culture     Status: Abnormal   Collection Time: 01/18/20 10:40 PM   Specimen: Urine, Clean Catch  Result Value Ref Range Status   Specimen Description   Final    URINE, CLEAN CATCH Performed at [facility], [address] [place], [place] [address]    Special Requests   Final    NONE Performed at [facility], [address] [place], [place] [address]    Culture >=100,000 COLONIES/mL ENTEROBACTER CLOACAE (A)  Final   Report Status 01/21/2020 FINAL  Final   Organism ID, Bacteria ENTEROBACTER CLOACAE (A)  Final      Susceptibility   Enterobacter cloacae - MIC*    CEFAZOLIN >=64 RESISTANT Resistant     CIPROFLOXACIN <=0.25 SENSITIVE Sensitive     GENTAMICIN <=1 SENSITIVE Sensitive     IMIPENEM <=0.25 SENSITIVE Sensitive     NITROFURANTOIN 32 SENSITIVE Sensitive     TRIMETH/SULFA <=20 SENSITIVE Sensitive     PIP/TAZO <=4 SENSITIVE Sensitive     * >=100,000 COLONIES/mL ENTEROBACTER CLOACAE  SARS Coronavirus 2 by RT PCR (hospital order, performed in [facility] hospital lab) Nasopharyngeal Nasopharyngeal Swab     Status: None   Collection Time: 01/18/20 11:35 PM   Specimen: Nasopharyngeal Swab  Result Value Ref Range Status   SARS Coronavirus 2 NEGATIVE NEGATIVE Final    Comment: (NOTE) SARS-CoV-2 target nucleic acids are NOT DETECTED.  The SARS-CoV-2 RNA is generally detectable in upper and lower respiratory specimens during the acute phase of infection. The lowest concentration of SARS-CoV-2 viral copies this assay can detect is 250 copies / mL. A negative result does not preclude SARS-CoV-2 infection and should not be used as the sole basis for treatment or other patient management decisions.  A negative result may occur with improper specimen collection / handling, submission of specimen other than nasopharyngeal swab, presence of viral mutation(s) within the areas targeted by this assay,  and inadequate number of viral copies (<250 copies / mL). A negative result must be combined with clinical observations, patient history, and epidemiological information.  Fact Sheet for Patients:   BoilerBrush.com.cy  Fact Sheet for Healthcare Providers: https://pope.com/  This test is not yet approved or  cleared by the [place] FDA and has been authorized for detection and/or diagnosis of SARS-CoV-2 by FDA under an Emergency Use Authorization (EUA).  This EUA will remain in effect (meaning this test can be used) for the duration of the COVID-19 declaration under Section 564(b)(1) of the Act, 21 U.S.C. section 360bbb-3(b)(1), unless the authorization is terminated or revoked sooner.  Performed at [facility], [address] [place],  [place] [id number]         Radiology Studies: No results found.      Scheduled Meds: . atenolol  25 mg Oral BID  . atorvastatin  40 mg Oral QHS  . ciprofloxacin  500 mg Oral BID  . DULoxetine  30 mg Oral Daily  . enoxaparin (LOVENOX) injection  40 mg Subcutaneous Q24H  . famotidine  40 mg Oral QHS  . insulin aspart  0-15 Units Subcutaneous TID WC  . insulin aspart  0-5 Units Subcutaneous QHS  . insulin aspart  10 Units Subcutaneous TID WC  . [START ON 01/23/2020] insulin glargine  50 Units Subcutaneous Daily  . pantoprazole  40 mg Oral Daily   Continuous Infusions:    LOS: 3 days     Jacquelin Hawkingalph Sammi Stolarz, MD [facility] 01/22/2020, 2:13 PM  If 7PM-7AM, please contact night-coverage [email or web address]

## 2020-01-22 NOTE — Progress Notes (Signed)
Patient ID: April Burton, female   DOB: 10/10/1955, 64 y.o.   MRN: [id number]    [facility] MD Progress Note Huey Romans, MD    Referring Physician:   01/22/2020 2:43 PM Patient Identification: April Burton MRN:  [id number]  Asked to see this patient in follow up after NP saw initially.   Currently on One to one with sitter and on IVC due to risk of clinical deterioration if discharged.   Patient post AMS most likely due to UTI which has cleared now with ABx  Patient though has history of chronic back pain, Major depression severe recurrent along with generalized anxiety.   Recently her PCP gave her suboxone and Valium -----aside from the delirium from UTI it is not clear but some extra valium are missing where daughter found the bottle in patient's hand where she may have taken extra as she got confused   Patient was placed on IVC when she tried to leave and was in an acute delirious state  Today, thought her MS is cleared for the most part and she does now now capacity for consent   Yesterday she did not due to delirium ---  Currently she knows why she is here and the issues and implications of not being treated or discharged too soon.   So she had temp loss of capacity due to delirium from UTI          Medical Diagnosis:  Principal Problem:   Acute metabolic encephalopathy Active Problems:   UTI (urinary tract infection)   Type 2 diabetes mellitus without complication (HCC)   HTN (hypertension)   Depression   Buprenorphine dependence (HCC)   Benzodiazepine (tranquilizer) overdose, undetermined intent, initial encounter   Anemia   Acute delirium   Vitals: Blood pressure (!) 119/58, pulse 79, temperature 98.1 F (36.7 C), temperature source Oral, resp. rate 17, height 5\' 8"  (1.727 m), weight (!) 106.1 kg, SpO2 99 %.Body mass index is 35.58 kg/m.   Psychiatric Diagnosis: Acute metabolic  encephalopathy   Pertinent Psychiatric Protocols and Assessments: AIMS:  , ,  ,  ,       CIWA:  CIWA-Ar Total: 4   COWS:     PHYSICAL EXAM  Physical Exam  SYSTEMS REVIEW  Review of Systems  MENTAL STATUS: Pertinents Only    Today she is alert cooperative oriented to person place date  Consciousness not clouded or fluctuant Mood improving not anxious No frank psychosis or mania No active SI HI or plans  Speech normal rate tone volume  Judgement insight reliability back to baseline Memory remote recent and immediate generally okay with general questions Rapport and eye contact okay  No other movement problems    Below are all normal   ADL's  Sleep   Cognition  Appetite  Movements   Psychomotor Activity:    strength & muscle tone:  gait & station:    __________________________        Current Medications: Current Facility-Administered Medications  Medication Dose Route Frequency Provider Last Rate Last Admin  . acetaminophen (TYLENOL) tablet 650 mg  650 mg Oral Q6H PRN , MD   650 mg at 01/21/20 1354   Or  . acetaminophen (TYLENOL) suppository 650 mg  650 mg Rectal Q6H PRN 01/23/20, MD      . atenolol (TENORMIN) tablet 25 mg  25 mg Oral BID Andris Baumann, MD   25 mg at 01/22/20 0905  . atorvastatin (  LIPITOR) tablet 40 mg  40 mg Oral QHS Narda Bonds, MD   40 mg at 01/21/20 2137  . ciprofloxacin (CIPRO) tablet 500 mg  500 mg Oral BID Narda Bonds, MD   500 mg at 01/22/20 0809  . clonazePAM (KLONOPIN) disintegrating tablet 1 mg  1 mg Oral BID Roselind Messier, MD   1 mg at 01/22/20 1433  . [START ON 01/23/2020] DULoxetine (CYMBALTA) DR capsule 60 mg  60 mg Oral Daily Roselind Messier, MD      . enoxaparin (LOVENOX) injection 40 mg  40 mg Subcutaneous Q24H Andris Baumann, MD   40 mg at 01/22/20 0905  . famotidine (PEPCID) tablet 40 mg  40 mg Oral QHS Narda Bonds, MD   40 mg at 01/21/20 2137  . gabapentin  (NEURONTIN) capsule 900 mg  900 mg Oral QHS Roselind Messier, MD      . insulin aspart (novoLOG) injection 0-15 Units  0-15 Units Subcutaneous TID WC Andris Baumann, MD   8 Units at 01/22/20 1235  . insulin aspart (novoLOG) injection 0-5 Units  0-5 Units Subcutaneous QHS Andris Baumann, MD   3 Units at 01/21/20 2315  . insulin aspart (novoLOG) injection 10 Units  10 Units Subcutaneous TID WC Narda Bonds, MD      . Melene Muller ON 01/23/2020] insulin glargine (LANTUS) injection 50 Units  50 Units Subcutaneous Daily Narda Bonds, MD      . LORazepam (ATIVAN) tablet 1 mg  1 mg Oral Q6H PRN Charm Rings, NP   1 mg at 01/22/20 0904  . ondansetron (ZOFRAN) tablet 4 mg  4 mg Oral Q6H PRN Andris Baumann, MD       Or  . ondansetron [facility]) injection 4 mg  4 mg Intravenous Q6H PRN Andris Baumann, MD      . pantoprazole (PROTONIX) EC tablet 40 mg  40 mg Oral Daily Narda Bonds, MD   40 mg at 01/22/20 4970    Lab Results:  Results for orders placed or performed during the hospital encounter of 01/18/20 (from the past 48 hour(s))  Glucose, capillary     Status: Abnormal   Collection Time: 01/20/20  4:41 PM  Result Value Ref Range   Glucose-Capillary 384 (H) 70 - 99 mg/dL    Comment: Glucose reference range applies only to samples taken after fasting for at least 8 hours.  Glucose, capillary     Status: Abnormal   Collection Time: 01/20/20  9:25 PM  Result Value Ref Range   Glucose-Capillary 183 (H) 70 - 99 mg/dL    Comment: Glucose reference range applies only to samples taken after fasting for at least 8 hours.  Procalcitonin     Status: None   Collection Time: 01/21/20  5:09 AM  Result Value Ref Range   Procalcitonin 0.99 ng/mL    Comment:        Interpretation: PCT > 0.5 ng/mL and <= 2 ng/mL: Systemic infection (sepsis) is possible, but other conditions are known to elevate PCT as well. (NOTE)       Sepsis PCT Algorithm           Lower Respiratory Tract                                       Infection PCT Algorithm    ----------------------------     ----------------------------  PCT < 0.25 ng/mL                PCT < 0.10 ng/mL          Strongly encourage             Strongly discourage   discontinuation of antibiotics    initiation of antibiotics    ----------------------------     -----------------------------       PCT 0.25 - 0.50 ng/mL            PCT 0.10 - 0.25 ng/mL               OR       >80% decrease in PCT            Discourage initiation of                                            antibiotics      Encourage discontinuation           of antibiotics    ----------------------------     -----------------------------         PCT >= 0.50 ng/mL              PCT 0.26 - 0.50 ng/mL                AND       <80% decrease in PCT             Encourage initiation of                                             antibiotics       Encourage continuation           of antibiotics    ----------------------------     -----------------------------        PCT >= 0.50 ng/mL                  PCT > 0.50 ng/mL               AND         increase in PCT                  Strongly encourage                                      initiation of antibiotics    Strongly encourage escalation           of antibiotics                                     -----------------------------                                           PCT <= 0.25 ng/mL  OR                                        > 80% decrease in PCT                                      Discontinue / Do not initiate                                             antibiotics  Performed at [facility], [address] [place], [place] [address]   CBC     Status: Abnormal   Collection Time: 01/21/20  5:09 AM  Result Value Ref Range   WBC 18.4 (H) 4.0 - 10.5 K/uL   RBC 2.81 (L) 3.87 - 5.11 MIL/uL   Hemoglobin 8.1 (L) 12.0 - 15.0 g/dL   HCT 84.1 (L) 36 - 46 %   MCV  86.5 80.0 - 100.0 fL   MCH 28.8 26.0 - 34.0 pg   MCHC 33.3 30.0 - 36.0 g/dL   RDW 32.4 40.1 - 02.7 %   Platelets 406 (H) 150 - 400 K/uL   nRBC 0.0 0.0 - 0.2 %    Comment: Performed at [facility], [address] [place], [place] [address]  Glucose, capillary     Status: Abnormal   Collection Time: 01/21/20  8:03 AM  Result Value Ref Range   Glucose-Capillary 198 (H) 70 - 99 mg/dL    Comment: Glucose reference range applies only to samples taken after fasting for at least 8 hours.  Glucose, capillary     Status: Abnormal   Collection Time: 01/21/20 12:35 PM  Result Value Ref Range   Glucose-Capillary 269 (H) 70 - 99 mg/dL    Comment: Glucose reference range applies only to samples taken after fasting for at least 8 hours.  Glucose, capillary     Status: Abnormal   Collection Time: 01/21/20  5:11 PM  Result Value Ref Range   Glucose-Capillary 235 (H) 70 - 99 mg/dL    Comment: Glucose reference range applies only to samples taken after fasting for at least 8 hours.  Glucose, capillary     Status: Abnormal   Collection Time: 01/21/20 10:31 PM  Result Value Ref Range   Glucose-Capillary 259 (H) 70 - 99 mg/dL    Comment: Glucose reference range applies only to samples taken after fasting for at least 8 hours.   Comment 1 Notify RN   Glucose, capillary     Status: Abnormal   Collection Time: 01/22/20  7:59 AM  Result Value Ref Range   Glucose-Capillary 236 (H) 70 - 99 mg/dL    Comment: Glucose reference range applies only to samples taken after fasting for at least 8 hours.  Glucose, capillary     Status: Abnormal   Collection Time: 01/22/20 12:28 PM  Result Value Ref Range   Glucose-Capillary 280 (H) 70 - 99 mg/dL    Comment: Glucose reference range applies only to samples taken after fasting for at least 8 hours.  CBC     Status: Abnormal   Collection Time: 01/22/20 12:45 PM  Result Value Ref Range   WBC 18.4 (H) 4.0 -  10.5 K/uL   RBC 2.87 (L) 3.87 - 5.11 MIL/uL    Hemoglobin 8.2 (L) 12.0 - 15.0 g/dL   HCT 16.124.8 (L) 36 - 46 %   MCV 86.4 80.0 - 100.0 fL   MCH 28.6 26.0 - 34.0 pg   MCHC 33.1 30.0 - 36.0 g/dL   RDW 09.614.9 04.511.5 - 40.915.5 %   Platelets 428 (H) 150 - 400 K/uL   nRBC 0.0 0.0 - 0.2 %    Comment: Performed at [facility], [address] [place], [place] [address]  CBC with Differential/Platelet     Status: Abnormal   Collection Time: 01/22/20 12:45 PM  Result Value Ref Range   WBC 18.3 (H) 4.0 - 10.5 K/uL   RBC 2.84 (L) 3.87 - 5.11 MIL/uL   Hemoglobin 8.1 (L) 12.0 - 15.0 g/dL   HCT 47.825.3 (L) 36 - 46 %   MCV 89.1 80.0 - 100.0 fL   MCH 28.5 26.0 - 34.0 pg   MCHC 32.0 30.0 - 36.0 g/dL   RDW 29.514.8 62.111.5 - 30.815.5 %   Platelets 440 (H) 150 - 400 K/uL   nRBC 0.0 0.0 - 0.2 %   Neutrophils Relative % 81 %   Neutro Abs 14.9 (H) 1.7 - 7.7 K/uL   Lymphocytes Relative 11 %   Lymphs Abs 2.0 0.7 - 4.0 K/uL   Monocytes Relative 7 %   Monocytes Absolute 1.2 (H) 0 - 1 K/uL   Eosinophils Relative 0 %   Eosinophils Absolute 0.1 0 - 0 K/uL   Basophils Relative 0 %   Basophils Absolute 0.1 0 - 0 K/uL   Immature Granulocytes 1 %   Abs Immature Granulocytes 0.17 (H) 0.00 - 0.07 K/uL    Comment: Performed at [facility], [address] [place], [place] [address]    Blood Alcohol level:  No results found for: [facility]  Metabolic Disorder Labs: Lab Results  Component Value Date   HGBA1C 8.6 (H) 01/19/2020   MPG 200.12 01/19/2020   No results found for: PROLACTIN No results found for: CHOL, TRIG, HDL, CHOLHDL, VLDL, LDLCALC   Past Psychiatric History:   Past Medical/Surgical History  Past Medical History:  Diagnosis Date  . Depression   . Diabetes mellitus without complication (HCC)   . Eczema   . GERD (gastroesophageal reflux disease)   . Hypertension   . Seizures (HCC)     Past Surgical History:  Procedure Laterality Date  . TONSILLECTOMY    . VAGINAL WOUND CLOSURE / REPAIR      Family Medical and Psychiatric History:    Family History  Problem Relation Age of Onset  . CAD Father     Social History "Additional/Addendum":   Social History   Substance and Sexual Activity  Alcohol Use No     Social History   Substance and Sexual Activity  Drug Use Yes   Comment: questionable heroin use    Social History   Socioeconomic History  . Marital status: Single    Spouse name: Not on file  . Number of children: Not on file  . Years of education: Not on file  . Highest education level: Not on file  Occupational History  . Not on file  Tobacco Use  . Smoking status: Never Smoker  . Smokeless tobacco: Never Used  Vaping Use  . Vaping Use: Never used  Substance and Sexual Activity  . Alcohol use: No  . Drug use: Yes    Comment: questionable  heroin use  . Sexual activity: Not on file  Other Topics Concern  . Not on file  Social History Narrative  . Not on file   Social Determinants of Health   Financial Resource Strain:   . Difficulty of Paying Living Expenses:   Food Insecurity:   . Worried About Programme researcher, broadcasting/film/video in the Last Year:   . Barista in the Last Year:   Transportation Needs:   . Freight forwarder (Medical):   Marland Kitchen Lack of Transportation (Non-Medical):   Physical Activity:   . Days of Exercise per Week:   . Minutes of Exercise per Session:   Stress:   . Feeling of Stress :   Social Connections:   . Frequency of Communication with Friends and Family:   . Frequency of Social Gatherings with Friends and Family:   . Attends Religious Services:   . Active Member of Clubs or Organizations:   . Attends Banker Meetings:   Marland Kitchen Marital Status:                            Summary:  Patient is currently out of acute delirium and on IVC --will allow one more night for obs after changing her psych meds  Discussed with her IM attending  Most likely can go home in am if stable and  IVC will then be rescinded    Roselind Messier, MD 01/22/2020,  2:43 PM  Total Time spent with patient: 25-30 min

## 2020-01-22 NOTE — Care Management Important Message (Signed)
Important Message  Patient Details  Name: April Burton MRN: [id number] Date of Birth: 09-05-1955   Medicare Important Message Given:  N/A - LOS <3 / Initial given by admissions     Olegario Messier A Sy Saintjean 01/22/2020, 7:16 AM

## 2020-01-22 NOTE — Progress Notes (Signed)
Central telemetry called and states patient had 10 beat run of Vtach , notified Dr. Caleb Popp

## 2020-01-22 NOTE — Progress Notes (Signed)
Inpatient Diabetes Program Recommendations  AACE/ADA: New Consensus Statement on Inpatient Glycemic Control  Target Ranges:  Prepandial:   less than 140 mg/dL      Peak postprandial:   less than 180 mg/dL (1-2 hours)      Critically ill patients:  140 - 180 mg/dL  Results for MERLIN, EGE (MRN [id number]) as of 01/22/2020 12:39  Ref. Range 01/21/2020 08:03 01/21/2020 12:35 01/21/2020 17:11 01/21/2020 22:31 01/22/2020 07:59 01/22/2020 12:28  Glucose-Capillary Latest Ref Range: 70 - 99 mg/dL 253 (H) 664 (H) 403 (H) 259 (H) 236 (H) 280 (H)    Review of Glycemic Control  Diabetes history: DM2 Outpatient Diabetes medications: Lantus 65 units BID, Humalog 35 units TID with meals, Metformin XR 1000 mg BID, Ozempic 1mg  Qweek Current orders for Inpatient glycemic control: Lantus 40 units daily, Novolog 0-15 units TID with meals, Novolog 0-5 units QHS  Inpatient Diabetes Program Recommendations:    Insulin-Basal: Please consider increasing Lantus to 45 units daily.  Insulin-Meal Coverage: Please consider ordering Novolog 5 units TID with meals for meal coverage if patient eats at least 50% of meals.  Thanks, , RN, MSN, CDE Diabetes Coordinator Inpatient Diabetes Program [phone] (Team Pager from 8am to 5pm)

## 2020-01-22 NOTE — Plan of Care (Signed)

## 2020-01-23 LAB — CBC
HCT: 26.2 % — ABNORMAL LOW (ref 36.0–46.0)
Hemoglobin: 8.7 g/dL — ABNORMAL LOW (ref 12.0–15.0)
MCH: 28.6 pg (ref 26.0–34.0)
MCHC: 33.2 g/dL (ref 30.0–36.0)
MCV: 86.2 fL (ref 80.0–100.0)
Platelets: 522 10*3/uL — ABNORMAL HIGH (ref 150–400)
RBC: 3.04 MIL/uL — ABNORMAL LOW (ref 3.87–5.11)
RDW: 14.9 % (ref 11.5–15.5)
WBC: 17 10*3/uL — ABNORMAL HIGH (ref 4.0–10.5)
nRBC: 0 % (ref 0.0–0.2)

## 2020-01-23 LAB — GLUCOSE, CAPILLARY
Glucose-Capillary: 271 mg/dL — ABNORMAL HIGH (ref 70–99)
Glucose-Capillary: 272 mg/dL — ABNORMAL HIGH (ref 70–99)

## 2020-01-23 LAB — BASIC METABOLIC PANEL WITH GFR
Anion gap: 12 (ref 5–15)
BUN: 9 mg/dL (ref 8–23)
CO2: 26 mmol/L (ref 22–32)
Calcium: 8.8 mg/dL — ABNORMAL LOW (ref 8.9–10.3)
Chloride: 98 mmol/L (ref 98–111)
Creatinine, Ser: 0.63 mg/dL (ref 0.44–1.00)
GFR calc Af Amer: >60 mL/min
GFR calc non Af Amer: >60 mL/min
Glucose, Bld: 289 mg/dL — ABNORMAL HIGH (ref 70–99)
Potassium: 3.6 mmol/L (ref 3.5–5.1)
Sodium: 136 mmol/L (ref 135–145)

## 2020-01-23 LAB — CULTURE, BLOOD (ROUTINE X 2)
Culture: NO GROWTH
Culture: NO GROWTH
Special Requests: ADEQUATE

## 2020-01-23 LAB — MAGNESIUM: Magnesium: 1.6 mg/dL — ABNORMAL LOW (ref 1.7–2.4)

## 2020-01-23 MED ORDER — INSULIN LISPRO 100 UNIT/ML ~~LOC~~ SOLN: 20.0000 [IU] | Freq: Three times a day (TID) | SUBCUTANEOUS | 11 refills | Status: AC

## 2020-01-23 MED ORDER — CLONAZEPAM 1 MG PO TBDP: 1.0000 mg | ORAL_TABLET | Freq: Two times a day (BID) | ORAL | 0 refills | Status: DC

## 2020-01-23 MED ORDER — INSULIN GLARGINE 100 UNIT/ML ~~LOC~~ SOLN: 50.0000 [IU] | Freq: Two times a day (BID) | SUBCUTANEOUS | 11 refills | Status: DC

## 2020-01-23 MED ORDER — CIPROFLOXACIN HCL 500 MG PO TABS: 500.0000 mg | ORAL_TABLET | Freq: Two times a day (BID) | ORAL | 0 refills | Status: AC

## 2020-01-23 MED ORDER — ENALAPRIL MALEATE 10 MG PO TABS: 10.0000 mg | ORAL_TABLET | Freq: Every day | ORAL | 0 refills | Status: DC

## 2020-01-23 NOTE — Plan of Care (Signed)

## 2020-01-23 NOTE — Progress Notes (Signed)
Central tele called RN and said pt had 11 beat V Tach at 0640, pt now in NSR. RN assessed pt, pt asymptomatic besides chronic back pain 5/10. Pt requesting motrin. RN notified Enedina Finner MD about V tach and pain.

## 2020-01-23 NOTE — Final Progress Note (Signed)
Physician Final Progress Note  Patient ID: April Burton MRN: [id number] DOB/AGE: [birth year] 64 y.o.  Admit date: 01/18/2020 Admitting provider: Andris Baumann, MD Discharge date: 01/23/2020   Admission Diagnoses:  Acute delirium /AMS and ---UTI --history of major depression and generalized anxiety ---  Discharge Diagnoses:  Principal Problem:   Acute metabolic encephalopathy Active Problems:   UTI (urinary tract infection)   Type 2 diabetes mellitus without complication (HCC)   HTN (hypertension)   Depression   Buprenorphine dependence (HCC)   Benzodiazepine (tranquilizer) overdose, undetermined intent, initial encounter   Anemia   Acute delirium    She had initially an acute delirium with clouded and fluctuant consciousness, confusion, and some paranoia At discharge she is oriented to person place and time  Not clouded or fluctuant -- No active SI HI or plans  No other new psychosis or mania Contracts for safety  No other new depression or anxiety  No other thoughts of harming self or safety  She can be discharged on her same psych meds but instead should take low dose klonopin   Follow up with Psychiatry ---in one to three weeks.     Consults:   Psych   Significant Findings/ Diagnostic Studies:   Procedures:  None   Discharge Condition:  As stable as possible   Disposition:  home   Diet: { as tolerated per team  Discharge Activity:  As tolerated      Total time spent taking care of this patient: 20 -25 minutes  Signed: Roselind Messier 01/23/2020, 11:54 AM

## 2020-01-23 NOTE — Discharge Summary (Signed)
[facility] [place] [facility]   PATIENT NAME: April Burton    MR#:  [id number]  DATE OF BIRTH:  1955-08-03  DATE OF ADMISSION:  01/18/2020 ADMITTING PHYSICIAN: Andris Baumann, MD  DATE OF DISCHARGE: 01/23/2020  PRIMARY CARE PHYSICIAN: System, Pcp Not In    ADMISSION DIAGNOSIS:  UTI (urinary tract infection) [N39.0] Urinary tract infection without hematuria, site unspecified [N39.0] Altered mental status, unspecified altered mental status type [R41.82]  DISCHARGE DIAGNOSIS:  acute metabolic encephalopathy improving UTI-Enterobacter acute delirium-- resolved history of major depression and generalized anxiety  SECONDARY DIAGNOSIS:   Past Medical History:  Diagnosis Date  . Depression   . Diabetes mellitus without complication (HCC)   . Eczema   . GERD (gastroesophageal reflux disease)   . Hypertension   . Seizures [facility])     HOSPITAL COURSE:   April Burton is a 64 y.o. femalewith medical history significant forHTN, DM, chronic pain on Suboxone, anemia, depression. Patient presented secondary to altered mental status in setting of UTI and sepsis, present on admission.  Acute metabolic encephalopathy Present on admission. Patient has some confusion. Some of this appear to be behavioral, however. Resolved. -Treat infection below  UTI-enterobacter Pyelonephritis Urinalysis is highly suggestive of infection. Since patient incurred high fevers, will treat as pyelonephritis. Blood cultures are no growth to date. Urine culture significant for Enterobacter hormaechei; sensitivities available -Continue ciprofloxacin 500 mg PO BID for total 7 days -Follow up blood culture negative  -PT/OT recommendations: No follow-up  Leukocytosis Slight improvement but seems to have stabilized and is still elevated. No flank pain. No evidence of abscess on CT scan. No recurrent fevers and procalcitonin is trended down. Possible leukocytosis may be lagging  behind -leukocytosis improving slowly. No fever.  Sepsis present on admission now resolved Present on admission with fever and leukocytosis. Secondary to UTI. No lactic acidosis. No hypotension.  -Antibiotics as mentioned above  Paranoia Lack of capacity Patient attempted to leave the hospital against medical advice.  - Psychiatry Dr. Smith Robert consulted and patient is currently under IVC (01/21/2020) -patient was evaluated Dr. Smith Robert today. Okay to discharge from psych standpoint. He recommends patient be on Klonopin 1 mg BID for her anxiety. I have given number 15 pills. She will need to follow-up with her psychiatrist as outpatient  Benzodiazepine overdose Per report, this appears to be unintentional. Patient confused and taking more pills than necessary. No symptoms of overdose currently.  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 Patient plans on discontinuing this after discussion with her primary physician.  Essential hypertension Patient is on atenolol, enalapril, hydrochlorothiazide, spironolactone as an outpatient. BP was soft on admission. Patient appears to have some rebound tachycardia vs tachycardia from infection. -Continue atenolol, enalapril (10 mg qd) and spironolactone. -hold HCTZ  Diabetes mellitus, type 2 Patient is on Lantus 65 units BID, Humalog 35 units TID with meals, metformin and Ozempic as an outpatient. Last hemoglobin A1C of 8.6 (01/19/2020). Started on SSI inpatient. Blood sugar uncontrolled today.  -Continue SSI -resume home meds with some adjustments  Depression Patient is on Cymbalta , gabapentin and Klonopin (new)  Hyperlipidemia -Continue Lipitor  GERD -Continue Nexium (Protonix while inpatient) and Pepcid   DVT prophylaxis: Lovenox Code Status:   Code Status: Full Code Family Communication: None at bedside Disposition Plan: Discharge today--cleared by Dr Smith Robert  CONSULTS OBTAINED:    DRUG ALLERGIES:   Allergies   Allergen Reactions  . Adhesive [Tape] Rash  . Diclofenac Sodium Other (See  Comments)    Caused GERD to become worse.  Marland Kitchen. Neomycin-Bacitracin-Polymyxin [Bacitracin-Neomycin-Polymyxin] Rash  . Pregabalin Nausea And Vomiting    DISCHARGE MEDICATIONS:   Allergies as of 01/23/2020      Reactions   Adhesive [tape] Rash   Diclofenac Sodium Other (See Comments)   Caused GERD to become worse.   Neomycin-bacitracin-polymyxin [bacitracin-neomycin-polymyxin] Rash   Pregabalin Nausea And Vomiting      Medication List    STOP taking these medications   diazepam 2 MG tablet Commonly known as: VALIUM   hydrochlorothiazide 25 MG tablet Commonly known as: HYDRODIURIL     TAKE these medications   aspirin EC 81 MG tablet Take 81 mg by mouth daily.   atenolol 25 MG tablet Commonly known as: TENORMIN Take 25 mg by mouth 2 (two) times daily.   atorvastatin 80 MG tablet Commonly known as: LIPITOR Take 40 mg by mouth in the morning and at bedtime.   buprenorphine-naloxone 2-0.5 mg Subl SL tablet Commonly known as: SUBOXONE Place 1 tablet under the tongue 2 (two) times daily.   ciprofloxacin 500 MG tablet Commonly known as: CIPRO Take 1 tablet (500 mg total) by mouth 2 (two) times daily for 5 days.   clonazePAM 1 MG disintegrating tablet Commonly known as: KLONOPIN Take 1 tablet (1 mg total) by mouth 2 (two) times daily.   docusate sodium 100 MG capsule Commonly known as: COLACE Take 200 mg by mouth daily.   DULoxetine 60 MG capsule Commonly known as: CYMBALTA Take 60 mg by mouth 2 (two) times daily.   enalapril 10 MG tablet Commonly known as: VASOTEC Take 1 tablet (10 mg total) by mouth daily. What changed: when to take this   EpiPen 2-Pak 0.3 mg/0.3 mL Soaj injection Generic drug: EPINEPHrine Inject 0.3 mg into the muscle as needed for anaphylaxis.   esomeprazole 40 MG capsule Commonly known as: NEXIUM Take 40 mg by mouth daily.   famotidine 40 MG tablet Commonly  known as: PEPCID Take 40 mg by mouth at bedtime.   gabapentin 300 MG capsule Commonly known as: NEURONTIN Take 900 mg by mouth 3 (three) times daily.   ibuprofen 600 MG tablet Commonly known as: ADVIL Take 600 mg by mouth every 8 (eight) hours as needed for mild pain or moderate pain.   insulin glargine 100 UNIT/ML injection Commonly known as: Lantus Inject 0.5 mLs (50 Units total) into the skin 2 (two) times daily. What changed: how much to take   insulin lispro 100 UNIT/ML injection Commonly known as: HUMALOG Inject 0.2 mLs (20 Units total) into the skin 3 (three) times daily before meals. What changed: how much to take   metFORMIN 500 MG 24 hr tablet Commonly known as: GLUCOPHAGE-XR Take 1,000 mg by mouth 2 (two) times daily.   multivitamin tablet Take 1 tablet by mouth daily.   nitroGLYCERIN 0.4 MG SL tablet Commonly known as: NITROSTAT Place 0.4 mg under the tongue every 5 (five) minutes x 3 doses as needed for chest pain.   Omega 3 1000 MG Caps Take 1,000 mg by mouth daily.   promethazine 25 MG tablet Commonly known as: PHENERGAN Take 25 mg by mouth every 8 (eight) hours as needed for nausea or vomiting.   Semaglutide (1 MG/DOSE) 2 MG/1.5ML Sopn Inject 1 mg into the skin every 7 (seven) days.   spironolactone 25 MG tablet Commonly known as: ALDACTONE Take 25 mg by mouth daily.       If you experience worsening of your  admission symptoms, develop shortness of breath, life threatening emergency, suicidal or homicidal thoughts you must seek medical attention immediately by calling 911 or calling your MD immediately  if symptoms less severe.  You Must read complete instructions/literature along with all the possible adverse reactions/side effects for all the Medicines you take and that have been prescribed to you. Take any new Medicines after you have completely understood and accept all the possible adverse reactions/side effects.   Please note  You were  cared for by a hospitalist during your hospital stay. If you have any questions about your discharge medications or the care you received while you were in the hospital after you are discharged, you can call the unit and asked to speak with the hospitalist on call if the hospitalist that took care of you is not available. Once you are discharged, your primary care physician will handle any further medical issues. Please note that NO REFILLS for any discharge medications will be authorized once you are discharged, as it is imperative that you return to your primary care physician (or establish a relationship with a primary care physician if you do not have one) for your aftercare needs so that they can reassess your need for medications and monitor your lab values. Today   SUBJECTIVE   Sitting in the chair. Alert and oriented times two. Appears calm. Hemodynamically otherwise stable. Eating good breakfast.  VITAL SIGNS:  Blood pressure (!) 124/53, pulse 74, temperature 98.4 F (36.9 C), temperature source Oral, resp. rate 20, height 5\' 8"  (1.727 m), weight (!) 106.1 kg, SpO2 95 %.  I/O:    Intake/Output Summary (Last 24 hours) at 01/23/2020 1255 Last data filed at 01/23/2020 1014 Gross per 24 hour  Intake 960 ml  Output --  Net 960 ml    PHYSICAL EXAMINATION:  GENERAL:  64 y.o.-year-old patient lying in the bed with no acute distress. Obese EYES: Pupils equal, round, reactive to light and accommodation. No scleral icterus.  HEENT: Head atraumatic, normocephalic. Oropharynx and nasopharynx clear.  NECK:  Supple, no jugular venous distention. No thyroid enlargement, no tenderness.  LUNGS: Normal breath sounds bilaterally, no wheezing, rales,rhonchi or crepitation. No use of accessory muscles of respiration.  CARDIOVASCULAR: S1, S2 normal. No murmurs, rubs, or gallops.  ABDOMEN: Soft, non-tender, non-distended. Bowel sounds present. No organomegaly or mass.  EXTREMITIES: No pedal edema,  cyanosis, or clubbing.  NEUROLOGIC: Cranial nerves II through XII are intact. Muscle strength 5/5 in all extremities. Sensation intact. Gait not checked.  PSYCHIATRIC: The patient is alert and oriented x 3. Mood and affect normal SKIN: No obvious rash, lesion, or ulcer.   DATA REVIEW:   CBC  Recent Labs  Lab 01/23/20 0509  WBC 17.0*  HGB 8.7*  HCT 26.2*  PLT 522*    Chemistries  Recent Labs  Lab 01/18/20 2158 01/19/20 0247 01/23/20 0509  NA 133*   < > 136  K 4.5   < > 3.6  CL 95*   < > 98  CO2 28   < > 26  GLUCOSE 331*   < > 289*  BUN 22   < > 9  CREATININE 0.84   < > 0.63  CALCIUM 9.2   < > 8.8*  MG  --    < > 1.6*  AST 18  --   --   ALT 20  --   --   ALKPHOS 88  --   --   BILITOT 1.5*  --   --    < > =  values in this interval not displayed.    Microbiology Results   Recent Results (from the past 240 hour(s))  Blood Culture (routine x 2)     Status: None   Collection Time: 01/18/20  9:58 PM   Specimen: BLOOD  Result Value Ref Range Status   Specimen Description BLOOD BLOOD LEFT FOREARM  Final   Special Requests   Final    BOTTLES DRAWN AEROBIC AND ANAEROBIC Blood Culture results may not be optimal due to an excessive volume of blood received in culture bottles   Culture   Final    NO GROWTH 5 DAYS Performed at [facility], [address] [place], [place] [address]    Report Status 01/23/2020 FINAL  Final  Blood Culture (routine x 2)     Status: None   Collection Time: 01/18/20 10:08 PM   Specimen: BLOOD  Result Value Ref Range Status   Specimen Description BLOOD BLOOD RIGHT FOREARM  Final   Special Requests   Final    BOTTLES DRAWN AEROBIC AND ANAEROBIC Blood Culture adequate volume   Culture   Final    NO GROWTH 5 DAYS Performed at [facility], [address] [place], [place] [address]    Report Status 01/23/2020 FINAL  Final  Urine Culture     Status: Abnormal   Collection Time: 01/18/20 10:40 PM   Specimen: Urine, Clean  Catch  Result Value Ref Range Status   Specimen Description   Final    URINE, CLEAN CATCH Performed at [facility], [address], [place], [place] [address]    Special Requests   Final    NONE Performed at [facility], [address], [place], [place] [address]    Culture >=100,000 COLONIES/mL ENTEROBACTER CLOACAE (A)  Final   Report Status 01/21/2020 FINAL  Final   Organism ID, Bacteria ENTEROBACTER CLOACAE (A)  Final      Susceptibility   Enterobacter cloacae - MIC*    CEFAZOLIN >=64 RESISTANT Resistant     CIPROFLOXACIN <=0.25 SENSITIVE Sensitive     GENTAMICIN <=1 SENSITIVE Sensitive     IMIPENEM <=0.25 SENSITIVE Sensitive     NITROFURANTOIN 32 SENSITIVE Sensitive     TRIMETH/SULFA <=20 SENSITIVE Sensitive     PIP/TAZO <=4 SENSITIVE Sensitive     * >=100,000 COLONIES/mL ENTEROBACTER CLOACAE  SARS Coronavirus 2 by RT PCR (hospital order, performed in [facility] hospital lab) Nasopharyngeal Nasopharyngeal Swab     Status: None   Collection Time: 01/18/20 11:35 PM   Specimen: Nasopharyngeal Swab  Result Value Ref Range Status   SARS Coronavirus 2 NEGATIVE NEGATIVE Final    Comment: (NOTE) SARS-CoV-2 target nucleic acids are NOT DETECTED.  The SARS-CoV-2 RNA is generally detectable in upper and lower respiratory specimens during the acute phase of infection. The lowest concentration of SARS-CoV-2 viral copies this assay can detect is 250 copies / mL. A negative result does not preclude SARS-CoV-2 infection and should not be used as the sole basis for treatment or other patient management decisions.  A negative result may occur with improper specimen collection / handling, submission of specimen other than nasopharyngeal swab, presence of viral mutation(s) within the areas targeted by this assay, and inadequate number of viral copies (<250 copies / mL). A negative result must be combined with clinical observations, patient history, and epidemiological  information.  Fact Sheet for Patients:   BoilerBrush.com.cy  Fact Sheet for Healthcare Providers: https://pope.com/  This test is not yet approved  or  cleared by the [place] and has been authorized for detection and/or diagnosis of SARS-CoV-2 by FDA under an Emergency Use Authorization (EUA).  This EUA will remain in effect (meaning this test can be used) for the duration of the COVID-19 declaration under Section 564(b)(1) of the Act, 21 U.S.C. section 360bbb-3(b)(1), unless the authorization is terminated or revoked sooner.  Performed at [facility], [address], [place], [place] [address]     RADIOLOGY:  No results found.   CODE STATUS:     Code Status Orders  (From admission, onward)         Start     Ordered   01/18/20 2339  Full code  Continuous        01/18/20 2340        Code Status History    This patient has a current code status but no historical code status.   Advance Care Planning Activity       TOTAL TIME TAKING CARE OF THIS PATIENT: *35* minutes.    Enedina Finner M.D  [facility]    CC: Primary care physician; System, Pcp Not In

## 2020-01-23 NOTE — Plan of Care (Signed)
  Problem: Education: Goal: Knowledge of General Education information will improve Description: Including pain rating scale, medication(s)/side effects and non-pharmacologic comfort measures 01/23/2020 1413 by Meyer Cory, RN Outcome: Adequate for Discharge 01/23/2020 1413 by Meyer Cory, RN Outcome: Progressing   Problem: Health Behavior/Discharge Planning: Goal: Ability to manage health-related needs will improve 01/23/2020 1413 by Meyer Cory, RN Outcome: Adequate for Discharge 01/23/2020 1413 by Meyer Cory, RN Outcome: Progressing   Problem: Clinical Measurements: Goal: Ability to maintain clinical measurements within normal limits will improve 01/23/2020 1413 by Meyer Cory, RN Outcome: Adequate for Discharge 01/23/2020 1413 by Meyer Cory, RN Outcome: Progressing Goal: Will remain free from infection 01/23/2020 1413 by Meyer Cory, RN Outcome: Adequate for Discharge 01/23/2020 1413 by Meyer Cory, RN Outcome: Progressing Goal: Diagnostic test results will improve 01/23/2020 1413 by Meyer Cory, RN Outcome: Adequate for Discharge 01/23/2020 1413 by Meyer Cory, RN Outcome: Progressing Goal: Respiratory complications will improve 01/23/2020 1413 by Meyer Cory, RN Outcome: Adequate for Discharge 01/23/2020 1413 by Meyer Cory, RN Outcome: Progressing Goal: Cardiovascular complication will be avoided 01/23/2020 1413 by Meyer Cory, RN Outcome: Adequate for Discharge 01/23/2020 1413 by Meyer Cory, RN Outcome: Progressing   Problem: Activity: Goal: Risk for activity intolerance will decrease 01/23/2020 1413 by Meyer Cory, RN Outcome: Adequate for Discharge 01/23/2020 1413 by Meyer Cory, RN Outcome: Progressing   Problem: Nutrition: Goal: Adequate nutrition will be maintained 01/23/2020 1413 by Meyer Cory, RN Outcome: Adequate for Discharge 01/23/2020 1413 by Meyer Cory, RN Outcome: Progressing   Problem:  Coping: Goal: Level of anxiety will decrease 01/23/2020 1413 by Meyer Cory, RN Outcome: Adequate for Discharge 01/23/2020 1413 by Meyer Cory, RN Outcome: Progressing   Problem: Elimination: Goal: Will not experience complications related to bowel motility 01/23/2020 1413 by Meyer Cory, RN Outcome: Adequate for Discharge 01/23/2020 1413 by Meyer Cory, RN Outcome: Progressing Goal: Will not experience complications related to urinary retention 01/23/2020 1413 by Meyer Cory, RN Outcome: Adequate for Discharge 01/23/2020 1413 by Meyer Cory, RN Outcome: Progressing   Problem: Pain Managment: Goal: General experience of comfort will improve 01/23/2020 1413 by Meyer Cory, RN Outcome: Adequate for Discharge 01/23/2020 1413 by Meyer Cory, RN Outcome: Progressing   Problem: Safety: Goal: Ability to remain free from injury will improve 01/23/2020 1413 by Meyer Cory, RN Outcome: Adequate for Discharge 01/23/2020 1413 by Meyer Cory, RN Outcome: Progressing   Problem: Skin Integrity: Goal: Risk for impaired skin integrity will decrease 01/23/2020 1413 by Meyer Cory, RN Outcome: Adequate for Discharge 01/23/2020 1413 by Meyer Cory, RN Outcome: Progressing

## 2020-01-23 NOTE — TOC Progression Note (Signed)
Transition of Care [facility]) - Progression Note    Patient Details  Name: April Burton MRN: [id number] Date of Birth: Mar 26, 1956  Transition of Care [facility]) CM/SW Contact  Barrie Dunker, RN Phone Number: 01/23/2020, 12:01 PM  Clinical Narrative:   [employer]  To set up transportation home, patient will need to sign the rider's waiver To be faxed to the [employer], they will arrive at 3 PM, the driver will call the nurses desk when they arrive I explained they need to go to the medical Mall entrance          Expected Discharge Plan and Services                                                 Social Determinants of Health (SDOH) Interventions    Readmission Risk Interventions No flowsheet data found.

## 2020-02-11 ENCOUNTER — Encounter: Payer: Self-pay | Admitting: Emergency Medicine

## 2020-02-11 ENCOUNTER — Other Ambulatory Visit: Payer: Self-pay

## 2020-02-11 DIAGNOSIS — F329 Major depressive disorder, single episode, unspecified: Secondary | ICD-10-CM | POA: Diagnosis present

## 2020-02-11 DIAGNOSIS — I10 Essential (primary) hypertension: Secondary | ICD-10-CM | POA: Diagnosis present

## 2020-02-11 DIAGNOSIS — T8143XA Infection following a procedure, organ and space surgical site, initial encounter: Principal | ICD-10-CM | POA: Diagnosis present

## 2020-02-11 DIAGNOSIS — B961 Klebsiella pneumoniae [K. pneumoniae] as the cause of diseases classified elsewhere: Secondary | ICD-10-CM | POA: Diagnosis present

## 2020-02-11 DIAGNOSIS — E1165 Type 2 diabetes mellitus with hyperglycemia: Secondary | ICD-10-CM | POA: Diagnosis present

## 2020-02-11 DIAGNOSIS — N938 Other specified abnormal uterine and vaginal bleeding: Secondary | ICD-10-CM | POA: Diagnosis present

## 2020-02-11 DIAGNOSIS — D509 Iron deficiency anemia, unspecified: Secondary | ICD-10-CM | POA: Diagnosis present

## 2020-02-11 DIAGNOSIS — K651 Peritoneal abscess: Secondary | ICD-10-CM | POA: Diagnosis present

## 2020-02-11 DIAGNOSIS — Z79899 Other long term (current) drug therapy: Secondary | ICD-10-CM

## 2020-02-11 DIAGNOSIS — Y836 Removal of other organ (partial) (total) as the cause of abnormal reaction of the patient, or of later complication, without mention of misadventure at the time of the procedure: Secondary | ICD-10-CM | POA: Diagnosis present

## 2020-02-11 DIAGNOSIS — Z9049 Acquired absence of other specified parts of digestive tract: Secondary | ICD-10-CM

## 2020-02-11 DIAGNOSIS — A419 Sepsis, unspecified organism: Secondary | ICD-10-CM | POA: Diagnosis present

## 2020-02-11 DIAGNOSIS — K219 Gastro-esophageal reflux disease without esophagitis: Secondary | ICD-10-CM | POA: Diagnosis present

## 2020-02-11 DIAGNOSIS — T8332XA Displacement of intrauterine contraceptive device, initial encounter: Secondary | ICD-10-CM | POA: Diagnosis present

## 2020-02-11 DIAGNOSIS — G9341 Metabolic encephalopathy: Secondary | ICD-10-CM | POA: Diagnosis present

## 2020-02-11 DIAGNOSIS — Z20822 Contact with and (suspected) exposure to covid-19: Secondary | ICD-10-CM | POA: Diagnosis present

## 2020-02-11 DIAGNOSIS — Z7982 Long term (current) use of aspirin: Secondary | ICD-10-CM

## 2020-02-11 DIAGNOSIS — F1120 Opioid dependence, uncomplicated: Secondary | ICD-10-CM | POA: Diagnosis present

## 2020-02-11 DIAGNOSIS — Y762 Prosthetic and other implants, materials and accessory obstetric and gynecological devices associated with adverse incidents: Secondary | ICD-10-CM | POA: Diagnosis present

## 2020-02-11 DIAGNOSIS — Z794 Long term (current) use of insulin: Secondary | ICD-10-CM

## 2020-02-11 LAB — COMPREHENSIVE METABOLIC PANEL
ALT: 11 U/L (ref 0–44)
AST: 15 U/L (ref 15–41)
Albumin: 2.8 g/dL — ABNORMAL LOW (ref 3.5–5.0)
Alkaline Phosphatase: 86 U/L (ref 38–126)
Anion gap: 14 (ref 5–15)
BUN: 16 mg/dL (ref 8–23)
CO2: 29 mmol/L (ref 22–32)
Calcium: 9.6 mg/dL (ref 8.9–10.3)
Chloride: 91 mmol/L — ABNORMAL LOW (ref 98–111)
Creatinine, Ser: 0.63 mg/dL (ref 0.44–1.00)
GFR calc Af Amer: >60 mL/min (ref >60)
GFR calc non Af Amer: >60 mL/min (ref >60)
Glucose, Bld: 208 mg/dL — ABNORMAL HIGH (ref 70–99)
Potassium: 3.7 mmol/L (ref 3.5–5.1)
Sodium: 134 mmol/L — ABNORMAL LOW (ref 135–145)
Total Bilirubin: 0.8 mg/dL (ref 0.3–1.2)
Total Protein: 7.3 g/dL (ref 6.5–8.1)

## 2020-02-11 LAB — CBC WITH DIFFERENTIAL/PLATELET
Abs Immature Granulocytes: 0.12 10*3/uL — ABNORMAL HIGH (ref 0.00–0.07)
Basophils Absolute: 0.1 10*3/uL (ref 0.0–0.1)
Basophils Relative: 0 %
Eosinophils Absolute: 0 10*3/uL (ref 0.0–0.5)
Eosinophils Relative: 0 %
HCT: 29.1 % — ABNORMAL LOW (ref 36.0–46.0)
Hemoglobin: 9.3 g/dL — ABNORMAL LOW (ref 12.0–15.0)
Immature Granulocytes: 1 %
Lymphocytes Relative: 9 %
Lymphs Abs: 1.9 10*3/uL (ref 0.7–4.0)
MCH: 27.8 pg (ref 26.0–34.0)
MCHC: 32 g/dL (ref 30.0–36.0)
MCV: 86.9 fL (ref 80.0–100.0)
Monocytes Absolute: 1.6 10*3/uL — ABNORMAL HIGH (ref 0.1–1.0)
Monocytes Relative: 8 %
Neutro Abs: 16.7 10*3/uL — ABNORMAL HIGH (ref 1.7–7.7)
Neutrophils Relative %: 82 %
Platelets: 535 10*3/uL — ABNORMAL HIGH (ref 150–400)
RBC: 3.35 MIL/uL — ABNORMAL LOW (ref 3.87–5.11)
RDW: 14.6 % (ref 11.5–15.5)
WBC: 20.4 10*3/uL — ABNORMAL HIGH (ref 4.0–10.5)
nRBC: 0 % (ref 0.0–0.2)

## 2020-02-11 LAB — URINALYSIS, COMPLETE (UACMP) WITH MICROSCOPIC
Bilirubin Urine: NEGATIVE
Glucose, UA: 50 mg/dL — AB
Ketones, ur: 5 mg/dL — AB
Nitrite: NEGATIVE
Protein, ur: 30 mg/dL — AB
Specific Gravity, Urine: 1.03 (ref 1.005–1.030)
pH: 5 (ref 5.0–8.0)

## 2020-02-11 LAB — LACTIC ACID, PLASMA: Lactic Acid, Venous: 1.7 mmol/L (ref 0.5–1.9)

## 2020-02-11 NOTE — ED Triage Notes (Signed)
Pt to ED via EMS from home states had gall bladder and ovaries removed in March, had drain placed right abd on the 6th of this month and now today seems to have concerns that drain my not be working properly or flushing correctly.  Patient poor historian in triage in regards to why she's here, site appears WNL, no redness or swelling.

## 2020-02-12 ENCOUNTER — Emergency Department: Payer: Medicare Other

## 2020-02-12 ENCOUNTER — Inpatient Hospital Stay
Admission: EM | Admit: 2020-02-12 | Discharge: 2020-02-16 | DRG: 862 | Disposition: A | Discharge status: Home Health | Payer: Medicare Other | Attending: Hospitalist | Admitting: Hospitalist

## 2020-02-12 DIAGNOSIS — G9341 Metabolic encephalopathy: Secondary | ICD-10-CM | POA: Diagnosis present

## 2020-02-12 DIAGNOSIS — R410 Disorientation, unspecified: Secondary | ICD-10-CM

## 2020-02-12 DIAGNOSIS — A419 Sepsis, unspecified organism: Secondary | ICD-10-CM | POA: Diagnosis present

## 2020-02-12 DIAGNOSIS — T8332XA Displacement of intrauterine contraceptive device, initial encounter: Secondary | ICD-10-CM

## 2020-02-12 DIAGNOSIS — T8143XA Infection following a procedure, organ and space surgical site, initial encounter: Secondary | ICD-10-CM

## 2020-02-12 DIAGNOSIS — F329 Major depressive disorder, single episode, unspecified: Secondary | ICD-10-CM

## 2020-02-12 DIAGNOSIS — T8189XA Other complications of procedures, not elsewhere classified, initial encounter: Secondary | ICD-10-CM

## 2020-02-12 DIAGNOSIS — Y762 Prosthetic and other implants, materials and accessory obstetric and gynecological devices associated with adverse incidents: Secondary | ICD-10-CM | POA: Diagnosis present

## 2020-02-12 DIAGNOSIS — M6008 Infective myositis, other site: Secondary | ICD-10-CM

## 2020-02-12 DIAGNOSIS — Z9049 Acquired absence of other specified parts of digestive tract: Secondary | ICD-10-CM | POA: Diagnosis not present

## 2020-02-12 DIAGNOSIS — F1120 Opioid dependence, uncomplicated: Secondary | ICD-10-CM | POA: Diagnosis present

## 2020-02-12 DIAGNOSIS — K5720 Diverticulitis of large intestine with perforation and abscess without bleeding: Secondary | ICD-10-CM | POA: Insufficient documentation

## 2020-02-12 DIAGNOSIS — N12 Tubulo-interstitial nephritis, not specified as acute or chronic: Secondary | ICD-10-CM

## 2020-02-12 DIAGNOSIS — Z7982 Long term (current) use of aspirin: Secondary | ICD-10-CM | POA: Diagnosis not present

## 2020-02-12 DIAGNOSIS — K651 Peritoneal abscess: Secondary | ICD-10-CM

## 2020-02-12 DIAGNOSIS — I898 Other specified noninfective disorders of lymphatic vessels and lymph nodes: Secondary | ICD-10-CM

## 2020-02-12 DIAGNOSIS — D649 Anemia, unspecified: Secondary | ICD-10-CM | POA: Diagnosis present

## 2020-02-12 DIAGNOSIS — F32A Depression, unspecified: Secondary | ICD-10-CM | POA: Diagnosis present

## 2020-02-12 DIAGNOSIS — B961 Klebsiella pneumoniae [K. pneumoniae] as the cause of diseases classified elsewhere: Secondary | ICD-10-CM | POA: Diagnosis present

## 2020-02-12 DIAGNOSIS — N938 Other specified abnormal uterine and vaginal bleeding: Secondary | ICD-10-CM | POA: Diagnosis present

## 2020-02-12 DIAGNOSIS — Z79899 Other long term (current) drug therapy: Secondary | ICD-10-CM | POA: Diagnosis not present

## 2020-02-12 DIAGNOSIS — N390 Urinary tract infection, site not specified: Secondary | ICD-10-CM | POA: Diagnosis present

## 2020-02-12 DIAGNOSIS — D509 Iron deficiency anemia, unspecified: Secondary | ICD-10-CM | POA: Diagnosis present

## 2020-02-12 DIAGNOSIS — K219 Gastro-esophageal reflux disease without esophagitis: Secondary | ICD-10-CM

## 2020-02-12 DIAGNOSIS — I10 Essential (primary) hypertension: Secondary | ICD-10-CM | POA: Diagnosis present

## 2020-02-12 DIAGNOSIS — E119 Type 2 diabetes mellitus without complications: Secondary | ICD-10-CM

## 2020-02-12 DIAGNOSIS — E1165 Type 2 diabetes mellitus with hyperglycemia: Secondary | ICD-10-CM | POA: Diagnosis present

## 2020-02-12 DIAGNOSIS — T85698A Other mechanical complication of other specified internal prosthetic devices, implants and grafts, initial encounter: Secondary | ICD-10-CM | POA: Diagnosis not present

## 2020-02-12 DIAGNOSIS — Z20822 Contact with and (suspected) exposure to covid-19: Secondary | ICD-10-CM | POA: Diagnosis present

## 2020-02-12 DIAGNOSIS — Y836 Removal of other organ (partial) (total) as the cause of abnormal reaction of the patient, or of later complication, without mention of misadventure at the time of the procedure: Secondary | ICD-10-CM | POA: Diagnosis present

## 2020-02-12 DIAGNOSIS — Z794 Long term (current) use of insulin: Secondary | ICD-10-CM | POA: Diagnosis not present

## 2020-02-12 LAB — SARS CORONAVIRUS 2 BY RT PCR (HOSPITAL ORDER, PERFORMED IN ~~LOC~~ HOSPITAL LAB): SARS Coronavirus 2: NEGATIVE

## 2020-02-12 LAB — URINE CULTURE
Culture: NO GROWTH
Special Requests: NORMAL

## 2020-02-12 LAB — PROCALCITONIN: Procalcitonin: 0.54 ng/mL

## 2020-02-12 LAB — PROTIME-INR
INR: 1.3 — ABNORMAL HIGH (ref 0.8–1.2)
Prothrombin Time: 15.8 seconds — ABNORMAL HIGH (ref 11.4–15.2)

## 2020-02-12 LAB — GLUCOSE, CAPILLARY
Glucose-Capillary: 191 mg/dL — ABNORMAL HIGH (ref 70–99)
Glucose-Capillary: 188 mg/dL — ABNORMAL HIGH (ref 70–99)
Glucose-Capillary: 201 mg/dL — ABNORMAL HIGH (ref 70–99)
Glucose-Capillary: 237 mg/dL — ABNORMAL HIGH (ref 70–99)
Glucose-Capillary: 247 mg/dL — ABNORMAL HIGH (ref 70–99)

## 2020-02-12 LAB — APTT: aPTT: 29 seconds (ref 24–36)

## 2020-02-12 LAB — BRAIN NATRIURETIC PEPTIDE: B Natriuretic Peptide: 204.6 pg/mL — ABNORMAL HIGH (ref 0.0–100.0)

## 2020-02-12 MED ORDER — ADULT MULTIVITAMIN W/MINERALS CH
1.0000 | ORAL_TABLET | Freq: Every day | ORAL | Status: DC
  Administered 2020-02-13 – 2020-02-16 (×4): 1 via ORAL
  Filled 2020-02-12 (×5): qty 1

## 2020-02-12 MED ORDER — SODIUM CHLORIDE 0.9 % IV BOLUS
1000.0000 mL | Freq: Once | INTRAVENOUS | Status: AC
  Administered 2020-02-12: 1000 mL via INTRAVENOUS

## 2020-02-12 MED ORDER — DULOXETINE HCL 30 MG PO CPEP
60.0000 mg | ORAL_CAPSULE | Freq: Two times a day (BID) | ORAL | Status: DC
  Administered 2020-02-12 – 2020-02-16 (×8): 60 mg via ORAL
  Filled 2020-02-12 (×6): qty 2
  Filled 2020-02-12: qty 1
  Filled 2020-02-12 (×2): qty 2

## 2020-02-12 MED ORDER — DOCUSATE SODIUM 100 MG PO CAPS
200.0000 mg | ORAL_CAPSULE | Freq: Every day | ORAL | Status: DC | PRN
  Administered 2020-02-14 – 2020-02-15 (×2): 200 mg via ORAL
  Filled 2020-02-12 (×2): qty 2

## 2020-02-12 MED ORDER — SODIUM CHLORIDE 0.9 % IV SOLN
2.0000 g | Freq: Once | INTRAVENOUS | Status: AC
  Administered 2020-02-12: 2 g via INTRAVENOUS
  Filled 2020-02-12: qty 2

## 2020-02-12 MED ORDER — INSULIN ASPART 100 UNIT/ML ~~LOC~~ SOLN
0.0000 [IU] | SUBCUTANEOUS | Status: DC
  Administered 2020-02-12: 5 [IU] via SUBCUTANEOUS
  Administered 2020-02-12: 2 [IU] via SUBCUTANEOUS
  Administered 2020-02-12: 3 [IU] via SUBCUTANEOUS
  Administered 2020-02-13: 2 [IU] via SUBCUTANEOUS
  Administered 2020-02-13: 3 [IU] via SUBCUTANEOUS
  Administered 2020-02-13: 7 [IU] via SUBCUTANEOUS
  Administered 2020-02-13 (×2): 3 [IU] via SUBCUTANEOUS
  Administered 2020-02-13: 7 [IU] via SUBCUTANEOUS
  Administered 2020-02-14: 5 [IU] via SUBCUTANEOUS
  Administered 2020-02-14 (×2): 7 [IU] via SUBCUTANEOUS
  Administered 2020-02-14: 3 [IU] via SUBCUTANEOUS
  Administered 2020-02-14: 7 [IU] via SUBCUTANEOUS
  Administered 2020-02-14: 5 [IU] via SUBCUTANEOUS
  Administered 2020-02-14: 3 [IU] via SUBCUTANEOUS
  Administered 2020-02-15 (×2): 2 [IU] via SUBCUTANEOUS
  Filled 2020-02-12 (×17): qty 1

## 2020-02-12 MED ORDER — METRONIDAZOLE IN NACL 5-0.79 MG/ML-% IV SOLN
500.0000 mg | Freq: Once | INTRAVENOUS | Status: AC
  Administered 2020-02-12: 500 mg via INTRAVENOUS
  Filled 2020-02-12: qty 100

## 2020-02-12 MED ORDER — PANTOPRAZOLE SODIUM 40 MG PO TBEC
40.0000 mg | DELAYED_RELEASE_TABLET | Freq: Every day | ORAL | Status: DC
  Administered 2020-02-13 – 2020-02-16 (×2): 40 mg via ORAL
  Filled 2020-02-12 (×5): qty 1

## 2020-02-12 MED ORDER — HYDRALAZINE HCL 20 MG/ML IJ SOLN: 5.0000 mg | INTRAMUSCULAR | Status: DC | PRN

## 2020-02-12 MED ORDER — PIPERACILLIN-TAZOBACTAM 3.375 G IVPB
3.3750 g | Freq: Three times a day (TID) | INTRAVENOUS | Status: DC
  Administered 2020-02-12 – 2020-02-15 (×9): 3.375 g via INTRAVENOUS
  Filled 2020-02-12 (×9): qty 50

## 2020-02-12 MED ORDER — IOHEXOL 300 MG/ML  SOLN
100.0000 mL | Freq: Once | INTRAMUSCULAR | Status: AC | PRN
  Administered 2020-02-12: 100 mL via INTRAVENOUS

## 2020-02-12 MED ORDER — OMEGA-3-ACID ETHYL ESTERS 1 G PO CAPS
1000.0000 mg | ORAL_CAPSULE | Freq: Every day | ORAL | Status: DC
  Administered 2020-02-13 – 2020-02-16 (×4): 1000 mg via ORAL
  Filled 2020-02-12 (×5): qty 1

## 2020-02-12 MED ORDER — ATORVASTATIN CALCIUM 20 MG PO TABS
40.0000 mg | ORAL_TABLET | Freq: Every day | ORAL | Status: DC
  Administered 2020-02-13 – 2020-02-16 (×4): 40 mg via ORAL
  Filled 2020-02-12 (×5): qty 2

## 2020-02-12 MED ORDER — LACTATED RINGERS IV SOLN: INTRAVENOUS | Status: AC

## 2020-02-12 MED ORDER — ASPIRIN EC 81 MG PO TBEC
81.0000 mg | DELAYED_RELEASE_TABLET | Freq: Every day | ORAL | Status: DC
  Administered 2020-02-13 – 2020-02-16 (×4): 81 mg via ORAL
  Filled 2020-02-12 (×5): qty 1

## 2020-02-12 MED ORDER — BUPRENORPHINE HCL-NALOXONE HCL 2-0.5 MG SL SUBL: 1.0000 | SUBLINGUAL_TABLET | Freq: Two times a day (BID) | SUBLINGUAL | Status: DC

## 2020-02-12 MED ORDER — GABAPENTIN 300 MG PO CAPS
900.0000 mg | ORAL_CAPSULE | Freq: Three times a day (TID) | ORAL | Status: DC
  Administered 2020-02-12 – 2020-02-16 (×11): 900 mg via ORAL
  Filled 2020-02-12 (×8): qty 3
  Filled 2020-02-12: qty 9
  Filled 2020-02-12 (×4): qty 3

## 2020-02-12 MED ORDER — NITROGLYCERIN 0.4 MG SL SUBL: 0.4000 mg | SUBLINGUAL_TABLET | SUBLINGUAL | Status: DC | PRN

## 2020-02-12 MED ORDER — MORPHINE SULFATE (PF) 2 MG/ML IV SOLN
1.0000 mg | INTRAVENOUS | Status: DC | PRN
  Administered 2020-02-12 – 2020-02-13 (×4): 1 mg via INTRAVENOUS
  Filled 2020-02-12 (×4): qty 1

## 2020-02-12 MED ORDER — INSULIN GLARGINE 100 UNIT/ML ~~LOC~~ SOLN
15.0000 [IU] | Freq: Two times a day (BID) | SUBCUTANEOUS | Status: DC
  Administered 2020-02-12 – 2020-02-15 (×7): 15 [IU] via SUBCUTANEOUS
  Filled 2020-02-12 (×12): qty 0.15

## 2020-02-12 MED ORDER — IBUPROFEN 200 MG PO TABS
200.0000 mg | ORAL_TABLET | Freq: Four times a day (QID) | ORAL | Status: DC | PRN
  Filled 2020-02-12: qty 1

## 2020-02-12 MED ORDER — ONDANSETRON HCL 4 MG/2ML IJ SOLN
4.0000 mg | INTRAMUSCULAR | Status: DC | PRN
  Administered 2020-02-12: 4 mg via INTRAVENOUS
  Filled 2020-02-12: qty 2
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
## 2020-02-12 NOTE — ED Notes (Signed)
Meal tray at bedside.  

## 2020-02-12 NOTE — Progress Notes (Signed)
PHARMACY -  BRIEF ANTIBIOTIC NOTE   Pharmacy has received consult(s) for Cefepime from an ED provider.  The patient's profile has been reviewed for ht/wt/allergies/indication/available labs.    One time order(s) placed for Cefepime 2gm  Further antibiotics/pharmacy consults should be ordered by admitting physician if indicated.                       Thank you, Valrie Hart A 02/12/2020  4:15 AM

## 2020-02-12 NOTE — ED Notes (Signed)
Patient transported to CT 

## 2020-02-12 NOTE — ED Provider Notes (Signed)
[facility] Emergency Department Provider Note  ____________________________________________   First MD Initiated Contact with Patient 02/12/20 [phone]     (approximate)  I have reviewed the triage vital signs and the nursing notes.   HISTORY  Chief Complaint Post-op Problem  Level 5 caveat:  history/ROS limited by altered mental status/confusion   HPI April Burton is a 64 y.o. female  with complicated medical history as listed below who presents for evaluation of her JP drain.  She recently (a few weeks ago) had a cholecystectomy at [facility] and had a JP drain placed.  The patient is not able to provide any history except "some people have been telling me I need to come to the hospital so I came," and she confirms that the drain is not working, but does not know any details.  According to the medical record, her daughter called the [facility] about 4 days ago to let them know the JP drain was not functional and that there was pain when she tried to flush it.  They told the patient to try and get an appointment with VIR sooner than 8/25 but it is currently scheduled.  It is unclear if she was successful in doing so when she called EMS tonight for further evaluation.  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  At the time she was altered to the point that she was placed under involuntary commitment for lack of capacity in order to obtain treatment.  Tonight she is confused but calm and cooperative.  She wants to know what is wrong with her drain and is agreeable to additional evaluation.  She denies any pain at this time.  She denies nausea and vomiting.  She denies fever/chills and dysuria.  She is vague on the rest of the details including the onset of the issue with the drain.         Past Medical History:  Diagnosis  Date  . Depression   . Diabetes mellitus without complication (HCC)   . Eczema   . GERD (gastroesophageal reflux disease)   . Hypertension   . Seizures [facility](HCC)     Patient Active Problem List   Diagnosis Date Noted  . Colonic diverticular abscess 02/12/2020  . Acute delirium 01/21/2020  . UTI (urinary tract infection) 01/18/2020  . Acute metabolic encephalopathy 01/18/2020  . Type 2 diabetes mellitus without complication (HCC) 01/18/2020  . HTN (hypertension) 01/18/2020  . Depression 01/18/2020  . Buprenorphine dependence (HCC) 01/18/2020  . Benzodiazepine (tranquilizer) overdose, undetermined intent, initial encounter 01/18/2020  . Anemia 01/18/2020    Past Surgical History:  Procedure Laterality Date  . CHOLECYSTECTOMY    . TONSILLECTOMY    . VAGINAL WOUND CLOSURE / REPAIR      Prior to Admission medications   Medication Sig Start Date End Date Taking? Authorizing Provider  aspirin EC 81 MG tablet Take 81 mg by mouth daily.   Yes [provider]  atenolol (TENORMIN) 25 MG tablet Take 12.5 mg by mouth 2 (two) times daily.    Yes [provider]  atorvastatin (LIPITOR) 80 MG tablet Take 40 mg by mouth in the morning and at bedtime.    Yes [provider]  buprenorphine-naloxone (SUBOXONE) 2-0.5 mg SUBL SL tablet Place 1 tablet under the tongue 2 (two) times daily. 01/14/20  Yes [provider]  DULoxetine (CYMBALTA) 60 MG capsule Take 60 mg by mouth 2 (two) times daily. 12/25/19  Yes [provider]  enalapril (VASOTEC) 10 MG tablet Take 1 tablet (10 mg total) by mouth daily. 01/23/20  Yes Enedina Finner, MD  esomeprazole (NEXIUM) 40 MG capsule Take 40 mg by mouth daily.    Yes [provider]  gabapentin (NEURONTIN) 300 MG capsule Take 900 mg by mouth 3 (three) times daily.    Yes [provider]  insulin glargine (LANTUS) 100 UNIT/ML injection Inject 0.5 mLs (50 Units total) into the skin 2 (two) times daily. Patient  taking differently: Inject 20 Units into the skin 2 (two) times daily.  01/23/20  Yes Enedina Finner, MD  insulin lispro (HUMALOG) 100 UNIT/ML injection Inject 0.2 mLs (20 Units total) into the skin 3 (three) times daily before meals. Patient taking differently: Inject 35 Units into the skin 3 (three) times daily before meals. Sliding scale 01/23/20  Yes Enedina Finner, MD  metFORMIN (GLUCOPHAGE-XR) 500 MG 24 hr tablet Take 1,000 mg by mouth 2 (two) times daily. 12/24/19  Yes [provider]  Multiple Vitamin (MULTIVITAMIN) tablet Take 1 tablet by mouth daily.   Yes [provider]  Omega 3 1000 MG CAPS Take 1,000 mg by mouth daily.   Yes [provider]  spironolactone (ALDACTONE) 25 MG tablet Take 25 mg by mouth daily. 12/05/19  Yes [provider]  clonazePAM (KLONOPIN) 1 MG disintegrating tablet Take 1 tablet (1 mg total) by mouth 2 (two) times daily. Patient not taking: Reported on 02/12/2020 01/23/20   Enedina Finner, MD  docusate sodium (COLACE) 100 MG capsule Take 200 mg by mouth daily.     [provider]  EPINEPHrine (EPIPEN 2-PAK) 0.3 mg/0.3 mL IJ SOAJ injection Inject 0.3 mg into the muscle as needed for anaphylaxis.     [provider]  famotidine (PEPCID) 40 MG tablet Take 40 mg by mouth at bedtime. Patient not taking: Reported on 02/12/2020 12/05/19   [provider]  ibuprofen (ADVIL) 600 MG tablet Take 600 mg by mouth every 8 (eight) hours as needed for mild pain or moderate pain.  Patient not taking: Reported on 02/12/2020    [provider]  nitroGLYCERIN (NITROSTAT) 0.4 MG SL tablet Place 0.4 mg under the tongue every 5 (five) minutes x 3 doses as needed for chest pain. 11/13/19   [provider]  promethazine (PHENERGAN) 25 MG tablet Take 25 mg by mouth every 8 (eight) hours as needed for nausea or vomiting.    [provider]  Semaglutide, 1 MG/DOSE, 2 MG/1.5ML SOPN Inject 1 mg into the skin every 7 (seven)  days. 01/14/20 02/13/20  [provider]    Allergies Acetaminophen, Empagliflozin, Adhesive [tape], Diclofenac sodium, Neomycin-bacitracin-polymyxin [bacitracin-neomycin-polymyxin], and Pregabalin  Family History  Problem Relation Age of Onset  . CAD Father     Social History Social History   Tobacco Use  . Smoking status: Never Smoker  . Smokeless tobacco: Never Used  Vaping Use  . Vaping Use: Never used  Substance Use Topics  . Alcohol use: No  . Drug use: Yes    Comment: questionable heroin use    Review of Systems Level 5 caveat:  history/ROS limited by altered mental status/confusion  ____________________________________________   PHYSICAL EXAM:  VITAL SIGNS: ED Triage Vitals  Enc Vitals Group     BP 02/11/20 2147 (!) 111/53     Pulse Rate 02/11/20 2147 88  Resp 02/11/20 2147 14     Temp 02/11/20 2147 99.5 F (37.5 C)     Temp Source 02/11/20 2147 Oral     SpO2 02/11/20 2147 100 %     Weight 02/11/20 2153 99.8 kg (220 lb)     Height 02/11/20 2153 1.651 m (5\' 5" )     Head Circumference --      Peak Flow --      Pain Score 02/11/20 2153 4     Pain Loc --      Pain Edu? --      Excl. in GC? --     Constitutional: Alert and oriented to self and location. Eyes: Conjunctivae are normal.  Head: Atraumatic. Nose: No congestion/rhinnorhea. Mouth/Throat: Patient is wearing a mask. Neck: No stridor.  No meningeal signs.   Cardiovascular: Normal rate, regular rhythm. Good peripheral circulation. Grossly normal heart sounds. Respiratory: Normal respiratory effort.  No retractions. Gastrointestinal: Obese.  Soft and nondistended.  No significant tenderness to palpation.  JP drain is present and the site appears healthy but there is no fluid in the drain. Musculoskeletal: No lower extremity tenderness nor edema. No gross deformities of extremities. Neurologic:  Normal speech and language. No gross focal neurologic deficits are appreciated.  Skin:  Skin  is warm, dry and intact.  ____________________________________________   LABS (all labs ordered are listed, but only abnormal results are displayed)  Labs Reviewed  CBC WITH DIFFERENTIAL/PLATELET - Abnormal; Notable for the following components:      Result Value   WBC 20.4 (*)    RBC 3.35 (*)    Hemoglobin 9.3 (*)    HCT 29.1 (*)    Platelets 535 (*)    Neutro Abs 16.7 (*)    Monocytes Absolute 1.6 (*)    Abs Immature Granulocytes 0.12 (*)    All other components within normal limits  COMPREHENSIVE METABOLIC PANEL - Abnormal; Notable for the following components:   Sodium 134 (*)    Chloride 91 (*)    Glucose, Bld 208 (*)    Albumin 2.8 (*)    All other components within normal limits  URINALYSIS, COMPLETE (UACMP) WITH MICROSCOPIC - Abnormal; Notable for the following components:   Color, Urine AMBER (*)    APPearance CLOUDY (*)    Glucose, UA 50 (*)    Hgb urine dipstick SMALL (*)    Ketones, ur 5 (*)    Protein, ur 30 (*)    Leukocytes,Ua SMALL (*)    Bacteria, UA RARE (*)    All other components within normal limits  SARS CORONAVIRUS 2 BY RT PCR (HOSPITAL ORDER, PERFORMED IN [facility])  URINE CULTURE  CULTURE, BLOOD (ROUTINE X 2)  CULTURE, BLOOD (ROUTINE X 2)  LACTIC ACID, PLASMA  PROCALCITONIN   ____________________________________________  EKG  No indication for emergent EKG tonight. ____________________________________________  RADIOLOGY 2154, personally viewed and evaluated these images (plain radiographs) as part of my medical decision making, as well as reviewing the written report by the radiologist.  I also discussed the case with the radiologist by phone.  ED MD interpretation: Large intra-abdominal abscess around the liver and extending down to the iliac his musculature with adjacent myositis.  The abscess is loculated with multiple separate collections and also involves the abdominal wall.  Official radiology  report(s): CT ABDOMEN PELVIS W CONTRAST  Result Date: 02/12/2020 CLINICAL DATA:  Nonlocalized abdominal pain, recent cholecystectomy. JP drain in place without drainage, concern for dislodgement. EXAM:  CT ABDOMEN AND PELVIS WITH CONTRAST TECHNIQUE: Multidetector CT imaging of the abdomen and pelvis was performed using the standard protocol following bolus administration of intravenous contrast. CONTRAST:  OMNIPAQUE IOHEXOL 300 MG/ML  SOLN COMPARISON:  None. FINDINGS: Lower chest: Atelectatic changes in the otherwise clear lung bases. Normal heart size. No pericardial effusion. Coronary artery calcifications are present. Hepatobiliary: Patient is post cholecystectomy. A hypoattenuating multilobular collection with punctate radiodensities is seen extending along the posterolateral liver and extending across the liver dome (6/66, 3/23. Some associated hyperemia in the adjacent liver parenchyma is noted. Collection measures up to 7.1 by 3.7 by 9.5 cm in maximal dimensions though difficult to obtain an accurate measurement given the crescentic nature and multi loculated appearance. Portion of the collection extends inferiorly from the liver tip and involves the posterior abdominal wall separately as well (3/43). A pigtail surgical drain is seen positioned along the right ninth interspace along the superficial margin of largest portion of this collection within a region of thickening of the intercostal musculature. No other focal concerning liver lesion. Slight prominence of the biliary tree is nonspecific in the post cholecystectomy state. Pancreas: Unremarkable. No pancreatic ductal dilatation or surrounding inflammatory changes. Spleen: Normal in size. No concerning splenic lesions. Adrenals/Urinary Tract: Normal adrenal glands. Bilateral perinephric stranding is noted. Slightly delayed appearance of the left renal nephrogram and bilaterally delayed excretion, nonspecific. No obstructing calculus,  hydronephrosis or urolithiasis. Urinary bladder is largely decompressed at the time of exam and therefore poorly evaluated by CT imaging. Some wall thickening is present possibly related to underdistention. Stomach/Bowel: Distal esophagus, stomach and duodenal sweep are unremarkable. No small bowel wall thickening or dilatation. Fecalization of the distal small bowel contents without evidence of mechanical obstruction. Appendix is not visualized. No focal inflammation the vicinity of the cecum to suggest an occult appendicitis. No colonic dilatation or wall thickening. Vascular/Lymphatic: Atherosclerotic calcifications within the abdominal aorta and branch vessels. No aneurysm or ectasia. No enlarged abdominopelvic lymph nodes. Reproductive: Anteverted uterus. The patient's radiopaque IUD appears rotated within the endometrial canal with the limbs extended towards the lower uterine segment. Simple appearing 2.4 cm cyst present in the left adnexa. Other: Heterogenous multiloculated collections extending along the hepatic capsule right posterior abdominal wall and the iliacus musculature. Musculoskeletal: Febrile loculated collection is seen involving the superior aspect of the iliacus musculature (3/54) measuring approximately 5.5 x 3.6 by up to 5 cm in craniocaudal extension. Some associated asymmetric thickening of the iliacus may reflect some associated myositis. No associated osseous erosion or destructive changes to suggest associated osteomyelitis at this time within the limitations of CT imaging. Multilevel degenerative changes are present in the imaged portions of the spine. Multilevel flowing anterior osteophytosis, compatible with features of diffuse idiopathic skeletal hyperostosis (DISH). IMPRESSION: 1. Postsurgical changes from cholecystectomy with heterogenous multiloculated collections extending along the hepatic capsule, right posterior abdominal wall as well as a separate collection along the iliacus  musculature with associated myositis. Appearance is concerning for postsurgical abscesses. A pigtail pleural drain is positioned at the level of the ninth interspace likely superficial to the predominant multiloculated collection along hepatic surface. 2. Malpositioned radiopaque IUD with the limbs extended towards the lower uterine segment. 3. 2.3 cm cyst in the left adnexa. Normal ovarian tissue is not well visualized. Could reflect a lymphocele in the setting of salpingooophorectomy, consider nonemergent ultrasound. 4. Fecalization of the distal small bowel contents without evidence of mechanical obstruction, may represent slowed intestinal transit. 5. Asymmetrically delayed left nephrogram with  some bilateral perinephric stranding, nonspecific, but can be seen with ascending urinary tract infection. Correlate with urinalysis. 6. Aortic Atherosclerosis (ICD10-I70.0). These results were called by telephone at the time of interpretation on 02/12/2020 at 4:14 am to provider [facility] , who verbally acknowledged these results. Electronically Signed   By: Kreg Shropshire M.D.   On: 02/12/2020 04:14    ____________________________________________   PROCEDURES   Procedure(s) performed (including Critical Care):  Procedures   ____________________________________________   INITIAL IMPRESSION / MDM / ASSESSMENT AND PLAN / ED COURSE  As part of my medical decision making, I reviewed the following data within the electronic MEDICAL RECORD NUMBER Nursing notes reviewed and incorporated, Labs reviewed , EKG interpreted , Old chart reviewed, Discussed with general surgery at [facility] (Dr. Leonette Most), Discussed with admitting physician (Dr. Arville Care), Discussed with radiologist and Notes from prior ED visits   Differential diagnosis includes, but is not limited to, metabolic encephalopathy, delirium in the setting of acute infection, JP drain malfunction, resolution of intra-abdominal fluid without drain malfunction,  persistent intra-abdominal fluid or abscess development, other nonspecific postsurgical complication, medication or drug side effect, Suboxone withdrawal, persistent urinary tract infection.  The patient appears confused and is exhibiting behavior similar to her prior visit.  However I am unclear as to what exactly is her baseline.  She is not in distress and has no significant tenderness to palpation of the abdomen.  I think it is possible the JP drain is not malfunctioning, there is simply no more fluid to drain.  She has a follow-up appointment in about a week with VIR and it is unclear if she is try to reschedule it for sooner.  Labs are notable for an essentially normal comprehensive metabolic panel other than slight decrease of her sodium and albumin.  Her CBC is notable for a leukocytosis of 20.4, but this is actually down a little bit from her leukocytosis at [facility] which was greater than 21.  Hemoglobin is lower than it has been in the past at 9.3 but this seems to be a slow but steady decline and she is not exhibiting signs of symptomatic anemia.  Urinalysis is notable for what appears to be a persistent urinary tract infection with 21-50 WBCs and leukocytes present and a few ketones.  Prior urine culture demonstrated Enterobacter cloacae, and according to the discharge summary from the last hospitalization about a month ago, she was treated with Cipro (resistance pattern showed that it was resistant to cefazolin but otherwise pansensitive).  However she does not remember taking Cipro and thinks that she took Augmentin but also says that she stopped taking her antibiotics early because she felt better.  Another urine culture is pending tonight.    I will obtain a CT scan of the abdomen and pelvis to assess whether or not she has a persistent or worsening intra-abdominal fluid collection or infection as well as to look for the placement of the JP drain.  I will hold off on empiric antibiotics for the UTI  until I see you what her CT scan looks like.  The patient understands and agrees with the plan.     Clinical Course as of Feb 12 724  Tue Feb 12, 2020  1610 I took a call from radiology to let me know that the patient CT scan is highly concerning for extensive infection all throughout her peritoneum with multiple lobulated collections over an extended area including the abdominal wall.  Her JP drain is not in place,  and even her IUD appears to be in the wrong place and requires nonemergent ultrasound.Given the complicated nature of the results I will discussed with the patient but anticipate she will require transfer back to [facility] where she had the surgery; I verified that the procedure took place about 5 months ago and was a combined surgery with general surgery and OB/GYN for a cholecystectomy and bilateral salpingo-oophorectomy.  Given the intra-abdominal infection I ordered Zosyn 3.375 g IV and of asked the patient to remain n.p.o.  I ordered a maintenance rate of lactated Ringer's at 150 mL/h.   [CF]  1610 I updated the patient and she confirmed that she wants to go to [facility]; she would have gone there originally except [place] out of the [place].  I think that is appropriate because of the extent of her intra-abdominal infection and her original surgeries at [facility] back in March.I have contacted the [facility] logistics center to discuss transfer.   [CF]  [phone] Discussed case by phone with [facility] [employer].  He will discuss the case with the surgery team(s) at [facility] and call me back.   [CF]  0505 I discussed the case by phone with Dr. Leonette Most with surgery at [facility].  Of note, he was the surgeon who originally performed a cholecystectomy on this patient in March.  Unfortunately, they are at capacity and have no more surgical beds available.  However, he reviewed the imaging and ask if we have VIR available to [place].  I confirmed that we do.  He said that at least the initial treatment she would receive at [facility]  would be VIR drain placement in the large abscess, and given that they are at capacity, he recommended admission to this facility with VIR drain placement and said that they would be happy to be contacted again with any issues or complications.I have updated the patient with the new plan.  There is no indication to emergently call general surgery at [facility] as this case is not immediately surgical.  I will consult the hospitalist for admission to discuss VIR consult and general surgery consult once she is inpatient.   [CF]  [phone] discussed case with Dr. Arville Care with the hospitalist service including the attempt to transfer and [facility] surgery recommendations.  He will admit.   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     MEDICATIONS GIVEN DURING THIS VISIT:  Medications  lactated ringers infusion ( Intravenous New Bag/Given 02/12/20 0425)  ondansetron (ZOFRAN) injection 4 mg (4 mg Intravenous Given 02/12/20 0638)  iohexol (OMNIPAQUE) 300 MG/ML solution 100 mL (100 mLs Intravenous Contrast Given 02/12/20 0333)  ceFEPIme (MAXIPIME) 2 g in sodium chloride 0.9 % 100 mL IVPB (0 g Intravenous Stopped 02/12/20 0457)  metroNIDAZOLE (FLAGYL) IVPB 500 mg (0 mg Intravenous Stopped 02/12/20 0630)  sodium chloride 0.9 % bolus 1,000 mL (1,000 mLs Intravenous New Bag/Given 02/12/20 8119)     ED Discharge Orders    None      *Please note:  April Burton was evaluated in Emergency Department on 02/12/2020 for the symptoms described in the history of present illness. She was evaluated in the context of the global COVID-19 pandemic, which necessitated consideration  that the patient might be at risk for infection with the SARS-CoV-2 virus that causes COVID-19. Institutional protocols and algorithms that pertain to the evaluation of patients at risk for COVID-19 are in a state of rapid change based on information released by regulatory bodies including the CDC and federal and state organizations. These policies and algorithms were followed during the patient's care in the ED.  Some ED evaluations and interventions may be delayed as a result of limited staffing during and after the pandemic.*  Note:  This document was prepared using Dragon voice recognition software and may include unintentional dictation errors.   Loleta Rose, MD 02/12/20 [phone]

## 2020-02-12 NOTE — ED Notes (Signed)
Pt to front desk confused where she is/ Pt sts, "I seen people leaving and I didn't know where I needed to go." Pt brought into triage 1 for re-evaluation.

## 2020-02-12 NOTE — ED Notes (Signed)
Messaged admitting MD for nausea medicine and to inform of MAPs in 50s

## 2020-02-12 NOTE — H&P (Signed)
History and Physical    April Burton IHK:[id number] DOB: 27-Feb-1956 DOA: 02/12/2020  Referring MD/NP/PA:   PCP: [facility]   Patient coming from:  The patient is coming from home.  At baseline, pt is independent for most of ADL.        Chief Complaint: AMS and JP drain problem  HPI: April Burton is a 64 y.o. female with medical history significant of   She recently had cholecystectomy at [facility]. Pt had peritoneal abscess and a JP drain placed on 8/6. Per her daughter (I called her daughter by phone), the drain is not working. Patient has nausea and mild central abdominal pain, but denies vomiting, diarrhea.  No fever or chills.  Patient does not have chest pain, cough, shortness of breath.  She was recently treated for Enterobacter UTI/pyelonephritis.Patient has little burning on urination, no dysuria or frequency. Per EDP's note, "According to the medical record, her daughter called the [facility] about 4 days ago to let them know the JP drain was not functional and that there was pain when she tried to flush it.  They told the patient to try and get an appointment with [facility] sooner than 8/25 but it is currently scheduled".   Per report, pt was confused earlier, but currently patient is alert, oriented x3 when I saw pt in ED. patient does not have unilateral numbness or tingling symptoms history no facial droop or slurred speech.  [facility] was contacted and there is no bed available.  Patient surgeon thought that this can be drained by IR here.   ED Course: pt was found to have WBC 20.4, negative Covid PCR, urinalysis (cloudy appearance, small amount of leukocyte, rare bacteria, WBC 21-50), lactic acid 1.7, procalcitonin 0.54, electrolytes renal function okay, temperature 99.2, soft blood pressure, heart rate 99, RR 18, oxygen saturation 90-100% on room air.  Patient is admitted to MedSurg bed as inpatient.  IR, Dr. Fredia Sorrow is consulted.   CT-abd/pelvis: 1. Postsurgical  changes from cholecystectomy with heterogenous multiloculated collections extending along the hepatic capsule, right posterior abdominal wall as well as a separate collection along the iliacus musculature with associated myositis. Appearance is concerning for postsurgical abscesses. A pigtail pleural drain is positioned at the level of the ninth interspace likely superficial to the predominant multiloculated collection along hepatic surface. 2. Malpositioned radiopaque IUD with the limbs extended towards the lower uterine segment. 3. 2.3 cm cyst in the left adnexa. Normal ovarian tissue is not well visualized. Could reflect a lymphocele in the setting of salpingooophorectomy, consider nonemergent ultrasound. 4. Fecalization of the distal small bowel contents without evidence of mechanical obstruction, may represent slowed intestinal transit. 5. Asymmetrically delayed left nephrogram with some bilateral perinephric stranding, nonspecific, but can be seen with ascending urinary tract infection. Correlate with urinalysis. 6. Aortic Atherosclerosis (ICD10-I70.0).    Review of Systems:   General: no fevers, chills, no body weight gain, has fatigue HEENT: no blurry vision, hearing changes or sore throat Respiratory: no dyspnea, coughing, wheezing CV: no chest pain, no palpitations GI: has nausea, abdominal pain, no diarrhea, constipation, vomiting, GU: no dysuria, has burning on urination, no increased urinary frequency, hematuria  Ext: has trace leg edema Neuro: no unilateral weakness, numbness, or tingling, no vision change or hearing loss Skin: no rash, no skin tear. MSK: No muscle spasm, no deformity, no limitation of range of movement in spin Heme: No easy bruising.  Travel history: No recent long distant travel.  Allergy:  Allergies  Allergen Reactions  .  Acetaminophen     Advised not to take due to fatty liver, but pt admits she will take acetaminophen on occasion.  .  Empagliflozin Other (See Comments)    Yeast infx  . Adhesive [Tape] Rash  . Diclofenac Sodium Other (See Comments)    Caused GERD to become worse.  Marland Kitchen. Neomycin-Bacitracin-Polymyxin [Bacitracin-Neomycin-Polymyxin] Rash  . Pregabalin Nausea And Vomiting    Past Medical History:  Diagnosis Date  . Depression   . Diabetes mellitus without complication (HCC)   . Eczema   . GERD (gastroesophageal reflux disease)   . Hypertension   . Seizures (HCC)     Past Surgical History:  Procedure Laterality Date  . CHOLECYSTECTOMY    . TONSILLECTOMY    . VAGINAL WOUND CLOSURE / REPAIR      Social History:  reports that she has never smoked. She has never used smokeless tobacco. She reports current drug use. She reports that she does not drink alcohol.  Family History:  Family History  Problem Relation Age of Onset  . CAD Father      Prior to Admission medications   Medication Sig Start Date End Date Taking? Authorizing Provider  aspirin EC 81 MG tablet Take 81 mg by mouth daily.   Yes [provider]  atenolol (TENORMIN) 25 MG tablet Take 12.5 mg by mouth 2 (two) times daily.    Yes [provider]  atorvastatin (LIPITOR) 80 MG tablet Take 40 mg by mouth in the morning and at bedtime.    Yes [provider]  buprenorphine-naloxone (SUBOXONE) 2-0.5 mg SUBL SL tablet Place 1 tablet under the tongue 2 (two) times daily. 01/14/20  Yes [provider]  DULoxetine (CYMBALTA) 60 MG capsule Take 60 mg by mouth 2 (two) times daily. 12/25/19  Yes [provider]  enalapril (VASOTEC) 10 MG tablet Take 1 tablet (10 mg total) by mouth daily. 01/23/20  Yes Enedina FinnerPatel, Sona, MD  esomeprazole (NEXIUM) 40 MG capsule Take 40 mg by mouth daily.    Yes [provider]  gabapentin (NEURONTIN) 300 MG capsule Take 900 mg by mouth 3 (three) times daily.    Yes [provider]  insulin glargine (LANTUS) 100 UNIT/ML injection Inject 0.5 mLs (50 Units total) into  the skin 2 (two) times daily. Patient taking differently: Inject 20 Units into the skin 2 (two) times daily.  01/23/20  Yes Enedina FinnerPatel, Sona, MD  insulin lispro (HUMALOG) 100 UNIT/ML injection Inject 0.2 mLs (20 Units total) into the skin 3 (three) times daily before meals. Patient taking differently: Inject 35 Units into the skin 3 (three) times daily before meals. Sliding scale 01/23/20  Yes Enedina FinnerPatel, Sona, MD  metFORMIN (GLUCOPHAGE-XR) 500 MG 24 hr tablet Take 1,000 mg by mouth 2 (two) times daily. 12/24/19  Yes [provider]  Multiple Vitamin (MULTIVITAMIN) tablet Take 1 tablet by mouth daily.   Yes [provider]  Omega 3 1000 MG CAPS Take 1,000 mg by mouth daily.   Yes [provider]  spironolactone (ALDACTONE) 25 MG tablet Take 25 mg by mouth daily. 12/05/19  Yes [provider]  clonazePAM (KLONOPIN) 1 MG disintegrating tablet Take 1 tablet (1 mg total) by mouth 2 (two) times daily. Patient not taking: Reported on 02/12/2020 01/23/20   Enedina FinnerPatel, Sona, MD  docusate sodium (COLACE) 100 MG capsule Take 200 mg by mouth daily.     [provider]  EPINEPHrine (EPIPEN 2-PAK) 0.3 mg/0.3 mL IJ SOAJ injection Inject 0.3 mg into  the muscle as needed for anaphylaxis.     [provider]  famotidine (PEPCID) 40 MG tablet Take 40 mg by mouth at bedtime. Patient not taking: Reported on 02/12/2020 12/05/19   [provider]  ibuprofen (ADVIL) 600 MG tablet Take 600 mg by mouth every 8 (eight) hours as needed for mild pain or moderate pain.  Patient not taking: Reported on 02/12/2020    [provider]  nitroGLYCERIN (NITROSTAT) 0.4 MG SL tablet Place 0.4 mg under the tongue every 5 (five) minutes x 3 doses as needed for chest pain. 11/13/19   [provider]  promethazine (PHENERGAN) 25 MG tablet Take 25 mg by mouth every 8 (eight) hours as needed for nausea or vomiting.    [provider]  Semaglutide, 1 MG/DOSE, 2 MG/1.5ML SOPN  Inject 1 mg into the skin every 7 (seven) days. 01/14/20 02/13/20  [provider]    Physical Exam: Vitals:   02/12/20 0630 02/12/20 0632 02/12/20 0730 02/12/20 0745  BP: (!) 100/35 (!) 108/45 (!) 105/47 (!) 106/45  Pulse: 84 79 86 85  Resp:      Temp:      TempSrc:      SpO2: 96% 100% 97% 100%  Weight:      Height:       General: Not in acute distress HEENT:       Eyes: PERRL, EOMI, no scleral icterus.       ENT: No discharge from the ears and nose, no pharynx injection, no tonsillar enlargement.        Neck: No JVD, no bruit, no mass felt. Heme: No neck lymph node enlargement. Cardiac: S1/S2, RRR, No murmurs, No gallops or rubs. Respiratory: No rales, wheezing, rhonchi or rubs. GI: Soft, nondistended, has mild tender in central abdomen, s/p of JP tube in right side of abdomen, no rebound pain, no organomegaly, BS present. GU: No hematuria Ext: has trace leg edema bilaterally. 1+DP/PT pulse bilaterally. Musculoskeletal: No joint deformities, No joint redness or warmth, no limitation of ROM in spin. Skin: No rashes.  Neuro: Alert, oriented X3, cranial nerves II-XII grossly intact, moves all extremities normally.  Psych: Patient is not psychotic, no suicidal or hemocidal ideation.  Labs on Admission: I have personally reviewed following labs and imaging studies  CBC: Recent Labs  Lab 02/11/20 2213  WBC 20.4*  NEUTROABS 16.7*  HGB 9.3*  HCT 29.1*  MCV 86.9  PLT 535*   Basic Metabolic Panel: Recent Labs  Lab 02/11/20 2213  NA 134*  K 3.7  CL 91*  CO2 29  GLUCOSE 208*  BUN 16  CREATININE 0.63  CALCIUM 9.6   GFR: Estimated Creatinine Clearance: 83.1 mL/min (by C-G formula based on SCr of 0.63 mg/dL). Liver Function Tests: Recent Labs  Lab 02/11/20 2213  AST 15  ALT 11  ALKPHOS 86  BILITOT 0.8  PROT 7.3  ALBUMIN 2.8*   No results for input(s): LIPASE, AMYLASE in the last 168 hours. No results for input(s): AMMONIA in the last 168  hours. Coagulation Profile: No results for input(s): INR, PROTIME in the last 168 hours. Cardiac Enzymes: No results for input(s): CKTOTAL, CKMB, CKMBINDEX, TROPONINI in the last 168 hours. BNP (last 3 results) No results for input(s): PROBNP in the last 8760 hours. HbA1C: No results for input(s): HGBA1C in the last 72 hours. CBG: No results for input(s): GLUCAP in the last 168 hours. Lipid Profile: No results for input(s): CHOL, HDL, LDLCALC, TRIG, CHOLHDL, LDLDIRECT in  the last 72 hours. Thyroid Function Tests: No results for input(s): TSH, T4TOTAL, FREET4, T3FREE, THYROIDAB in the last 72 hours. Anemia Panel: No results for input(s): VITAMINB12, FOLATE, FERRITIN, TIBC, IRON, RETICCTPCT in the last 72 hours. Urine analysis:    Component Value Date/Time   COLORURINE AMBER (A) 02/11/2020 2213   APPEARANCEUR CLOUDY (A) 02/11/2020 2213   LABSPEC 1.030 02/11/2020 2213   PHURINE 5.0 02/11/2020 2213   GLUCOSEU 50 (A) 02/11/2020 2213   HGBUR SMALL (A) 02/11/2020 2213   BILIRUBINUR NEGATIVE 02/11/2020 2213   KETONESUR 5 (A) 02/11/2020 2213   PROTEINUR 30 (A) 02/11/2020 2213   NITRITE NEGATIVE 02/11/2020 2213   LEUKOCYTESUR SMALL (A) 02/11/2020 2213   Sepsis Labs: @LABRCNTIP (procalcitonin:4,lacticidven:4) ) Recent Results (from the past 240 hour(s))  SARS Coronavirus 2 by RT PCR (hospital order, performed in [facility] hospital lab) Nasopharyngeal Nasopharyngeal Swab     Status: None   Collection Time: 02/12/20  4:27 AM   Specimen: Nasopharyngeal Swab  Result Value Ref Range Status   SARS Coronavirus 2 NEGATIVE NEGATIVE Final    Comment: (NOTE) SARS-CoV-2 target nucleic acids are NOT DETECTED.  The SARS-CoV-2 RNA is generally detectable in upper and lower respiratory specimens during the acute phase of infection. The lowest concentration of SARS-CoV-2 viral copies this assay can detect is 250 copies / mL. A negative result does not preclude SARS-CoV-2 infection and should not  be used as the sole basis for treatment or other patient management decisions.  A negative result may occur with improper specimen collection / handling, submission of specimen other than nasopharyngeal swab, presence of viral mutation(s) within the areas targeted by this assay, and inadequate number of viral copies (<250 copies / mL). A negative result must be combined with clinical observations, patient history, and epidemiological information.  Fact Sheet for Patients:   02/14/20  Fact Sheet for Healthcare Providers: BoilerBrush.com.cy  This test is not yet approved or  cleared by the https://pope.com/ FDA and has been authorized for detection and/or diagnosis of SARS-CoV-2 by FDA under an Emergency Use Authorization (EUA).  This EUA will remain in effect (meaning this test can be used) for the duration of the COVID-19 declaration under Section 564(b)(1) of the Act, 21 U.S.C. section 360bbb-3(b)(1), unless the authorization is terminated or revoked sooner.  Performed at [facility], [address] [place], [place]      Radiological Exams on Admission: CT ABDOMEN PELVIS W CONTRAST  Result Date: 02/12/2020 CLINICAL DATA:  Nonlocalized abdominal pain, recent cholecystectomy. JP drain in place without drainage, concern for dislodgement. EXAM: CT ABDOMEN AND PELVIS WITH CONTRAST TECHNIQUE: Multidetector CT imaging of the abdomen and pelvis was performed using the standard protocol following bolus administration of intravenous contrast. CONTRAST:  02/14/2020 OMNIPAQUE IOHEXOL 300 MG/ML  SOLN COMPARISON:  None. FINDINGS: Lower chest: Atelectatic changes in the otherwise clear lung bases. Normal heart size. No pericardial effusion. Coronary artery calcifications are present. Hepatobiliary: Patient is post cholecystectomy. A hypoattenuating multilobular collection with punctate radiodensities is seen extending along the  posterolateral liver and extending across the liver dome (6/66, 3/23. Some associated hyperemia in the adjacent liver parenchyma is noted. Collection measures up to 7.1 by 3.7 by 9.5 cm in maximal dimensions though difficult to obtain an accurate measurement given the crescentic nature and multi loculated appearance. Portion of the collection extends inferiorly from the liver tip and involves the posterior abdominal wall separately as well (3/43). A pigtail surgical drain is seen positioned along the  right ninth interspace along the superficial margin of largest portion of this collection within a region of thickening of the intercostal musculature. No other focal concerning liver lesion. Slight prominence of the biliary tree is nonspecific in the post cholecystectomy state. Pancreas: Unremarkable. No pancreatic ductal dilatation or surrounding inflammatory changes. Spleen: Normal in size. No concerning splenic lesions. Adrenals/Urinary Tract: Normal adrenal glands. Bilateral perinephric stranding is noted. Slightly delayed appearance of the left renal nephrogram and bilaterally delayed excretion, nonspecific. No obstructing calculus, hydronephrosis or urolithiasis. Urinary bladder is largely decompressed at the time of exam and therefore poorly evaluated by CT imaging. Some wall thickening is present possibly related to underdistention. Stomach/Bowel: Distal esophagus, stomach and duodenal sweep are unremarkable. No small bowel wall thickening or dilatation. Fecalization of the distal small bowel contents without evidence of mechanical obstruction. Appendix is not visualized. No focal inflammation the vicinity of the cecum to suggest an occult appendicitis. No colonic dilatation or wall thickening. Vascular/Lymphatic: Atherosclerotic calcifications within the abdominal aorta and branch vessels. No aneurysm or ectasia. No enlarged abdominopelvic lymph nodes. Reproductive: Anteverted uterus. The patient's radiopaque  IUD appears rotated within the endometrial canal with the limbs extended towards the lower uterine segment. Simple appearing 2.4 cm cyst present in the left adnexa. Other: Heterogenous multiloculated collections extending along the hepatic capsule right posterior abdominal wall and the iliacus musculature. Musculoskeletal: Febrile loculated collection is seen involving the superior aspect of the iliacus musculature (3/54) measuring approximately 5.5 x 3.6 by up to 5 cm in craniocaudal extension. Some associated asymmetric thickening of the iliacus may reflect some associated myositis. No associated osseous erosion or destructive changes to suggest associated osteomyelitis at this time within the limitations of CT imaging. Multilevel degenerative changes are present in the imaged portions of the spine. Multilevel flowing anterior osteophytosis, compatible with features of diffuse idiopathic skeletal hyperostosis (DISH). IMPRESSION: 1. Postsurgical changes from cholecystectomy with heterogenous multiloculated collections extending along the hepatic capsule, right posterior abdominal wall as well as a separate collection along the iliacus musculature with associated myositis. Appearance is concerning for postsurgical abscesses. A pigtail pleural drain is positioned at the level of the ninth interspace likely superficial to the predominant multiloculated collection along hepatic surface. 2. Malpositioned radiopaque IUD with the limbs extended towards the lower uterine segment. 3. 2.3 cm cyst in the left adnexa. Normal ovarian tissue is not well visualized. Could reflect a lymphocele in the setting of salpingooophorectomy, consider nonemergent ultrasound. 4. Fecalization of the distal small bowel contents without evidence of mechanical obstruction, may represent slowed intestinal transit. 5. Asymmetrically delayed left nephrogram with some bilateral perinephric stranding, nonspecific, but can be seen with ascending  urinary tract infection. Correlate with urinalysis. 6. Aortic Atherosclerosis (ICD10-I70.0). These results were called by telephone at the time of interpretation on 02/12/2020 at 4:14 am to provider [facility] , who verbally acknowledged these results. Electronically Signed   By: Kreg Shropshire M.D.   On: 02/12/2020 04:14     EKG: Not done in ED, will get one.   Assessment/Plan Principal Problem:   Peritoneal abscess (HCC) Active Problems:   UTI (urinary tract infection)   Acute metabolic encephalopathy   Type 2 diabetes mellitus without complication (HCC)   HTN (hypertension)   Depression   Buprenorphine dependence (HCC)   Normocytic anemia   GERD (gastroesophageal reflux disease)   Sepsis (HCC)   Sepsis due to peritoneal abscess [facility]): JP drain is not working.  Dr. Fredia Sorrow of IR is consulted.  Patient will need  Ct-guided drainage per Dr. Fredia Sorrow.  Patient meets criteria for sepsis with leukocytosis, tachycardia.  Lactic acid is normal.  Procalcitonin 0.54. Blood pressure is soft, but hemodynamically stable currently.  -Will admit to MedSurg bed as inpatient -IV Zosyn (patient received 1 dose of cefepime and Flagyl in ED) -Blood culture -will get Procalcitonin and trend lactic acid levels per sepsis protocol. -IVF: 1L of NS bolus in ED, followed by 100 cc/h  -As needed morphine for pain and Zofran for nausea -INR/PTT -NPO  UTI (urinary tract infection): Patient was recently admitted and treated for UTI/pyelonephritis.  Patient still has burning on urination.  Still has positive urine analysis -On Zosyn -Follow-up urine culture  Acute metabolic encephalopathy: Seem to have resolved.  Patient currently is alert, oriented x3.  No focal neuro deficit on physical examination. -Observe closely -Hold off Suboxone per her daughter  Type 2 diabetes mellitus without complication [facility]): Recent A1c 8.6, poorly controlled.  Patient is taking semaglutide, Metformin, Humalog, Lantus at  home -Decrease Lantus from 20 units to 15 units twice daily -Sliding scale insulin  HTN (hypertension) -Hold all blood pressure medications including atenolol, enalapril and spironolactone due to softer blood pressure and sepsis  Depression -Continue home medications  Buprenorphine dependence (HCC): Per her daughter, Suboxone makes patient too drowsy and was stopped at the day before yesterday by her daughter. -Hold off Suboxone per her daughter.  Normocytic anemia: Hemoglobin stable.  8.7 on 01/23/2020 --> 9.3 -Follow-up with CBC  GERD (gastroesophageal reflux disease) -Protonix    DVT ppx: SCD Code Status:  Partial code per her daughter (OK for CPR, but no intubation). Family Communication:  Yes, patient's daughter by phone Disposition Plan:  Anticipate discharge back to previous environment Consults called:  IR, Dr. Fredia Sorrow Admission status: Med-surg bed as inpt         Status is: Inpatient  Remains inpatient appropriate because:Inpatient level of care appropriate due to severity of illness.  Patient has multiple comorbidities, including recent peritoneal abscess with JP drain placement, presents with JP drain not working, still has peritoneal abscess and sepsis.  Her presentation is highly complicated.  Patient is at high risk of deteriorating.  Will need to be treated in hospital for at least 2 days.   Dispo: The patient is from: Home              Anticipated d/c is to: Home              Anticipated d/c date is: 2 days              Patient currently is not medically stable to d/c.          Date of Service 02/12/2020    Lorretta Harp [facility]   If 7PM-7AM, please contact night-coverage [email or web address] 02/12/2020, 8:40 AM

## 2020-02-12 NOTE — ED Notes (Signed)
Pt sleeping at this time. Pt oxygen sats decreased to 89% on RA. When pt woken up sats increased to 93-95%. Pt placed on 2L [place] for support while sleeping.

## 2020-02-12 NOTE — Consult Note (Signed)
Pharmacy Antibiotic Note  April Burton is a 64 y.o. female with history of recent cholecystectomy at [facility] with JP drain admitted on 02/12/2020 with peritoneal abscess.  Pharmacy has been consulted for Zosyn dosing.  Plan: Zosyn 3.375g IV q8h (4 hour infusion).  Height: 5\' 5"  (165.1 cm) Weight: 99.8 kg (220 lb) IBW/kg (Calculated) : 57  Temp (24hrs), Avg:99.4 F (37.4 C), Min:99.2 F (37.3 C), Max:99.5 F (37.5 C)  Recent Labs  Lab 02/11/20 2213  WBC 20.4*  CREATININE 0.63  LATICACIDVEN 1.7    Estimated Creatinine Clearance: 83.1 mL/min (by C-G formula based on SCr of 0.63 mg/dL).    Allergies  Allergen Reactions  . Acetaminophen     Advised not to take due to fatty liver, but pt admits she will take acetaminophen on occasion.  . Empagliflozin Other (See Comments)    Yeast infx  . Adhesive [Tape] Rash  . Diclofenac Sodium Other (See Comments)    Caused GERD to become worse.  02/13/20 Neomycin-Bacitracin-Polymyxin [Bacitracin-Neomycin-Polymyxin] Rash  . Pregabalin Nausea And Vomiting    Antimicrobials this admission: Cefepime + Flagyl x 1 in ED on 8/17  Zosyn 8/17 >>   Dose adjustments this admission: n/a  Microbiology results: 8/17 BCx: pending 8/16 UCx: pending   Thank you for allowing pharmacy to be a part of this patient's care.  9/16 02/12/2020 8:44 AM

## 2020-02-13 ENCOUNTER — Inpatient Hospital Stay: Payer: Medicare Other

## 2020-02-13 DIAGNOSIS — K651 Peritoneal abscess: Secondary | ICD-10-CM

## 2020-02-13 LAB — CBC
HCT: 24.3 % — ABNORMAL LOW (ref 36.0–46.0)
Hemoglobin: 8 g/dL — ABNORMAL LOW (ref 12.0–15.0)
MCH: 28 pg (ref 26.0–34.0)
MCHC: 32.9 g/dL (ref 30.0–36.0)
MCV: 85 fL (ref 80.0–100.0)
Platelets: 476 10*3/uL — ABNORMAL HIGH (ref 150–400)
RBC: 2.86 MIL/uL — ABNORMAL LOW (ref 3.87–5.11)
RDW: 14.5 % (ref 11.5–15.5)
WBC: 16.9 10*3/uL — ABNORMAL HIGH (ref 4.0–10.5)
nRBC: 0 % (ref 0.0–0.2)

## 2020-02-13 LAB — BASIC METABOLIC PANEL
Anion gap: 13 (ref 5–15)
BUN: 9 mg/dL (ref 8–23)
CO2: 29 mmol/L (ref 22–32)
Calcium: 9.3 mg/dL (ref 8.9–10.3)
Chloride: 93 mmol/L — ABNORMAL LOW (ref 98–111)
Creatinine, Ser: 0.62 mg/dL (ref 0.44–1.00)
GFR calc Af Amer: >60 mL/min (ref >60)
GFR calc non Af Amer: >60 mL/min (ref >60)
Glucose, Bld: 221 mg/dL — ABNORMAL HIGH (ref 70–99)
Potassium: 3.3 mmol/L — ABNORMAL LOW (ref 3.5–5.1)
Sodium: 135 mmol/L (ref 135–145)

## 2020-02-13 LAB — GLUCOSE, CAPILLARY
Glucose-Capillary: 197 mg/dL — ABNORMAL HIGH (ref 70–99)
Glucose-Capillary: 211 mg/dL — ABNORMAL HIGH (ref 70–99)
Glucose-Capillary: 222 mg/dL — ABNORMAL HIGH (ref 70–99)
Glucose-Capillary: 247 mg/dL — ABNORMAL HIGH (ref 70–99)
Glucose-Capillary: 303 mg/dL — ABNORMAL HIGH (ref 70–99)
Glucose-Capillary: 340 mg/dL — ABNORMAL HIGH (ref 70–99)

## 2020-02-13 MED ORDER — FENTANYL CITRATE (PF) 100 MCG/2ML IJ SOLN
INTRAMUSCULAR | Status: DC | PRN
  Administered 2020-02-13 (×2): 50 ug via INTRAVENOUS

## 2020-02-13 MED ORDER — MIDAZOLAM HCL 2 MG/2ML IJ SOLN
INTRAMUSCULAR | Status: AC
  Filled 2020-02-13: qty 2

## 2020-02-13 MED ORDER — MORPHINE SULFATE 15 MG PO TABS
15.0000 mg | ORAL_TABLET | ORAL | Status: DC | PRN
  Administered 2020-02-13 – 2020-02-14 (×3): 15 mg via ORAL
  Filled 2020-02-13 (×4): qty 1

## 2020-02-13 MED ORDER — SODIUM CHLORIDE 0.9% FLUSH
5.0000 mL | Freq: Three times a day (TID) | INTRAVENOUS | Status: DC
  Administered 2020-02-13 – 2020-02-16 (×8): 5 mL

## 2020-02-13 MED ORDER — FENTANYL CITRATE (PF) 100 MCG/2ML IJ SOLN
INTRAMUSCULAR | Status: AC
  Filled 2020-02-13: qty 2

## 2020-02-13 MED ORDER — MIDAZOLAM HCL 2 MG/2ML IJ SOLN
INTRAMUSCULAR | Status: DC | PRN
  Administered 2020-02-13 (×2): 1 mg via INTRAVENOUS

## 2020-02-13 NOTE — Progress Notes (Signed)
Pt given to me from IR post JP drain placement/ reinsertion. Dressing C/d/i. Bulbs on suction.   1130- flushed drains 63ml each. Emptied top drain total of 21ml out milky green color. Emptied bottom drain about 53ml out red in color. Both drain bulb deflated for suction

## 2020-02-13 NOTE — Progress Notes (Signed)
Patient refuses to wear telemetry box

## 2020-02-13 NOTE — Plan of Care (Signed)
Care plan implemented

## 2020-02-13 NOTE — Progress Notes (Signed)
Mobility Specialist - Progress Note   02/13/20 1523  Mobility  Activity Ambulated in room;Ambulated to bathroom;Ambulated in hall  Level of Assistance Standby assist, set-up cues, supervision of patient - no hands on  Assistive Device Front wheel walker  Distance Ambulated (ft) 180 ft  Mobility Response Tolerated well  Mobility performed by Mobility specialist  $Mobility charge 1 Mobility    Pre-mobility: 86 HR, 127/53 BP, 99% SpO2 Post-mobility: 98 HR, 118/45 BP, 97% SpO2   Pt was laying in bed upon arrival. Pt agreed to mobility session. Pt stated she was having abdominal pain, rating it 8/10. However, she was still willing to continue session. Pt able to get to EOB SBA. Pt S2S and ambulated 180' total w/ SBA, using a RW. CGA utilized for safety precautions. After ambulating the hallway, pt went to the bathroom and was able to perform personal hygiene SBA. Then, pt ambulated to recliner. Overall, pt tolerated session well, although, pain was consistent throughout session. Nurse was notified. Pt was left in recliner w/ call bell and phone placed in reach.    Sibley Rolison Mobility Specialist  02/13/20, 3:30 PM

## 2020-02-13 NOTE — Progress Notes (Signed)
PROGRESS NOTE    April Burton  OXB:[id number] DOB: 09-22-1955 DOA: 02/12/2020 PCP: [facility]    Assessment & Plan:   Principal Problem:   Peritoneal abscess [facility]) Active Problems:   UTI (urinary tract infection)   Acute metabolic encephalopathy   Type 2 diabetes mellitus without complication (HCC)   HTN (hypertension)   Depression   Buprenorphine dependence (HCC)   Normocytic anemia   GERD (gastroesophageal reflux disease)   Sepsis (HCC)    Sepsis due to peritoneal abscess [facility]): JP drain is not working.  Patient meets criteria for sepsis with leukocytosis, tachycardia.  Lactic acid is normal.  Procalcitonin 0.54.  PLAN: --IR placed 2 drains today --continue zosyn --trend procal --switch to oral morphine  UTI (urinary tract infection):  Patient was recently admitted and treated for UTI/pyelonephritis.  Patient still has burning on urination.  Still has positive urine analysis --currently on zosyn as above --f/u urine cx  Acute metabolic encephalopathy: Seem to have resolved.  Patient currently is alert, oriented x3.  No focal neuro deficit on physical examination. -Hold off Suboxone per her daughter  Type 2 diabetes mellitus without complication [facility]): Recent A1c 8.6, poorly controlled.  Patient is taking semaglutide, Metformin, Humalog, Lantus at home --continue Lantus 15u BID -Sliding scale insulin  HTN (hypertension) -Hold home atenolol, enalapril and spironolactone for now due to intermittent soft BP  Depression -Continue home medications  Buprenorphine dependence (HCC): Per her daughter, Suboxone makes patient too drowsy and was stopped at the day before yesterday by her daughter. -Hold off Suboxone per her daughter.  Normocytic anemia, iron def : Hemoglobin stable.  8.7 on 01/23/2020 --> 9.3 -Follow-up with CBC --start iron supplement  GERD (gastroesophageal reflux disease) -Protonix   DVT prophylaxis:  SCD/Compression stockings Code Status: Full code  Family Communication:  Status is: inpatient Dispo:   The patient is from: home Anticipated d/c is to: pending PT eval Anticipated d/c date is: 2-3 days Patient currently is not medically stable to d/c due to: on zosyn currently for abdominal abscess, just received new drains   Subjective and Interval History:  IR placed 2 abdominal drains today.  Pt continued to have some abdominal pain.     Objective: Vitals:   02/13/20 1040 02/13/20 1045 02/13/20 1149 02/13/20 1949  BP: (!) 107/53 (!) 106/57 (!) 134/51 (!) 118/48  Pulse: 84 81 80 71  Resp: (!) 21 19 18 20   Temp:   97.9 F (36.6 C) 97.6 F (36.4 C)  TempSrc:   Oral   SpO2: 99% 97% 96% 95%  Weight:      Height:        Intake/Output Summary (Last 24 hours) at 02/14/2020 0041 Last data filed at 02/13/2020 2200 Gross per 24 hour  Intake 120 ml  Output --  Net 120 ml   Filed Weights   02/11/20 2153  Weight: 99.8 kg    Examination:   Constitutional: NAD, AAOx3 HEENT: conjunctivae and lids normal, EOMI CV: RRR no M,R,G. Distal pulses +2.  No cyanosis.   RESP: CTA B/L, normal respiratory effort  GI: +BS, ND, 1 drain outputting purulent fluid, 1 drain outputting serosanguinous fluid Extremities: No effusions, edema, or tenderness in BLE SKIN: warm, dry  Neuro: II - XII grossly intact.  Sensation intact Psych: Normal mood and affect.     Data Reviewed: I have personally reviewed following labs and imaging studies  CBC: Recent Labs  Lab 02/11/20 2213 02/13/20 0525  WBC 20.4* 16.9*  NEUTROABS  16.7*  --   HGB 9.3* 8.0*  HCT 29.1* 24.3*  MCV 86.9 85.0  PLT 535* 476*   Basic Metabolic Panel: Recent Labs  Lab 02/11/20 2213 02/13/20 0525  NA 134* 135  K 3.7 3.3*  CL 91* 93*  CO2 29 29  GLUCOSE 208* 221*  BUN 16 9  CREATININE 0.63 0.62  CALCIUM 9.6 9.3   GFR: Estimated Creatinine Clearance: 83.1 mL/min (by C-G formula based on SCr of 0.62 mg/dL). Liver  Function Tests: Recent Labs  Lab 02/11/20 2213  AST 15  ALT 11  ALKPHOS 86  BILITOT 0.8  PROT 7.3  ALBUMIN 2.8*   No results for input(s): LIPASE, AMYLASE in the last 168 hours. No results for input(s): AMMONIA in the last 168 hours. Coagulation Profile: Recent Labs  Lab 02/12/20 0928  INR 1.3*   Cardiac Enzymes: No results for input(s): CKTOTAL, CKMB, CKMBINDEX, TROPONINI in the last 168 hours. BNP (last 3 results) No results for input(s): PROBNP in the last 8760 hours. HbA1C: No results for input(s): HGBA1C in the last 72 hours. CBG: Recent Labs  Lab 02/13/20 0837 02/13/20 1151 02/13/20 1702 02/13/20 2028 02/14/20 0001  GLUCAP 197* 222* 303* 340* 330*   Lipid Profile: No results for input(s): CHOL, HDL, LDLCALC, TRIG, CHOLHDL, LDLDIRECT in the last 72 hours. Thyroid Function Tests: No results for input(s): TSH, T4TOTAL, FREET4, T3FREE, THYROIDAB in the last 72 hours. Anemia Panel: No results for input(s): VITAMINB12, FOLATE, FERRITIN, TIBC, IRON, RETICCTPCT in the last 72 hours. Sepsis Labs: Recent Labs  Lab 02/11/20 2213  PROCALCITON 0.54  LATICACIDVEN 1.7    Recent Results (from the past 240 hour(s))  Urine Culture     Status: None   Collection Time: 02/11/20 10:13 PM   Specimen: Urine, Random  Result Value Ref Range Status   Specimen Description   Final    URINE, RANDOM Performed at [facility], [address] [place], [place] [address]    Special Requests   Final    Normal Performed at [facility], [address] [place], [place] [address]    Culture   Final    NO GROWTH Performed at [facility], [address] [place], [place] [address]    Report Status 02/12/2020 FINAL  Final  Blood culture (routine x 2)     Status: None (Preliminary result)   Collection Time: 02/12/20  4:20 AM   Specimen: BLOOD  Result Value Ref Range Status   Specimen Description BLOOD RIGHT FA  Final   Special Requests   Final     BOTTLES DRAWN AEROBIC AND ANAEROBIC Blood Culture results may not be optimal due to an inadequate volume of blood received in culture bottles   Culture   Final    NO GROWTH 1 DAY Performed at [facility], [address], [place], [place] [address]    Report Status PENDING  Incomplete  Blood culture (routine x 2)     Status: None (Preliminary result)   Collection Time: 02/12/20  4:20 AM   Specimen: BLOOD  Result Value Ref Range Status   Specimen Description BLOOD LEFT FA  Final   Special Requests   Final    BOTTLES DRAWN AEROBIC AND ANAEROBIC Blood Culture adequate volume   Culture   Final    NO GROWTH 1 DAY Performed at [facility], [address], [place], [place] [address]    Report Status PENDING  Incomplete  SARS Coronavirus 2 by  RT PCR (hospital order, performed in [facility] hospital lab) Nasopharyngeal Nasopharyngeal Swab     Status: None   Collection Time: 02/12/20  4:27 AM   Specimen: Nasopharyngeal Swab  Result Value Ref Range Status   SARS Coronavirus 2 NEGATIVE NEGATIVE Final    Comment: (NOTE) SARS-CoV-2 target nucleic acids are NOT DETECTED.  The SARS-CoV-2 RNA is generally detectable in upper and lower respiratory specimens during the acute phase of infection. The lowest concentration of SARS-CoV-2 viral copies this assay can detect is 250 copies / mL. A negative result does not preclude SARS-CoV-2 infection and should not be used as the sole basis for treatment or other patient management decisions.  A negative result may occur with improper specimen collection / handling, submission of specimen other than nasopharyngeal swab, presence of viral mutation(s) within the areas targeted by this assay, and inadequate number of viral copies (<250 copies / mL). A negative result must be combined with clinical observations, patient history, and epidemiological information.  Fact Sheet for Patients:    BoilerBrush.com.cy  Fact Sheet for Healthcare Providers: https://pope.com/  This test is not yet approved or  cleared by the [place] FDA and has been authorized for detection and/or diagnosis of SARS-CoV-2 by FDA under an Emergency Use Authorization (EUA).  This EUA will remain in effect (meaning this test can be used) for the duration of the COVID-19 declaration under Section 564(b)(1) of the Act, 21 U.S.C. section 360bbb-3(b)(1), unless the authorization is terminated or revoked sooner.  Performed at [facility], [address] [place], [place] [address]   Aerobic/Anaerobic Culture (surgical/deep wound)     Status: None (Preliminary result)   Collection Time: 02/13/20 10:32 AM   Specimen: Abscess  Result Value Ref Range Status   Specimen Description   Final    ABSCESS RIGHT LOWER PERITONEAL Performed at [facility], [address] [place], [place] [address]    Special Requests   Final    ABSCESS Performed at [facility], [address] [place], [place] [address]    Gram Stain PENDING  Incomplete   Culture PENDING  Incomplete   Report Status PENDING  Incomplete      Radiology Studies: CT ABDOMEN PELVIS W CONTRAST  Result Date: 02/12/2020 CLINICAL DATA:  Nonlocalized abdominal pain, recent cholecystectomy. JP drain in place without drainage, concern for dislodgement. EXAM: CT ABDOMEN AND PELVIS WITH CONTRAST TECHNIQUE: Multidetector CT imaging of the abdomen and pelvis was performed using the standard protocol following bolus administration of intravenous contrast. CONTRAST:  OMNIPAQUE IOHEXOL 300 MG/ML  SOLN COMPARISON:  None. FINDINGS: Lower chest: Atelectatic changes in the otherwise clear lung bases. Normal heart size. No pericardial effusion. Coronary artery calcifications are present. Hepatobiliary: Patient is post cholecystectomy. A hypoattenuating multilobular collection with  punctate radiodensities is seen extending along the posterolateral liver and extending across the liver dome (6/66, 3/23. Some associated hyperemia in the adjacent liver parenchyma is noted. Collection measures up to 7.1 by 3.7 by 9.5 cm in maximal dimensions though difficult to obtain an accurate measurement given the crescentic nature and multi loculated appearance. Portion of the collection extends inferiorly from the liver tip and involves the posterior abdominal wall separately as well (3/43). A pigtail surgical drain is seen positioned along the right ninth interspace along the superficial margin of largest portion of this collection within a region of thickening of the intercostal musculature. No other focal concerning liver lesion. Slight prominence of the biliary tree is nonspecific  in the post cholecystectomy state. Pancreas: Unremarkable. No pancreatic ductal dilatation or surrounding inflammatory changes. Spleen: Normal in size. No concerning splenic lesions. Adrenals/Urinary Tract: Normal adrenal glands. Bilateral perinephric stranding is noted. Slightly delayed appearance of the left renal nephrogram and bilaterally delayed excretion, nonspecific. No obstructing calculus, hydronephrosis or urolithiasis. Urinary bladder is largely decompressed at the time of exam and therefore poorly evaluated by CT imaging. Some wall thickening is present possibly related to underdistention. Stomach/Bowel: Distal esophagus, stomach and duodenal sweep are unremarkable. No small bowel wall thickening or dilatation. Fecalization of the distal small bowel contents without evidence of mechanical obstruction. Appendix is not visualized. No focal inflammation the vicinity of the cecum to suggest an occult appendicitis. No colonic dilatation or wall thickening. Vascular/Lymphatic: Atherosclerotic calcifications within the abdominal aorta and branch vessels. No aneurysm or ectasia. No enlarged abdominopelvic lymph nodes.  Reproductive: Anteverted uterus. The patient's radiopaque IUD appears rotated within the endometrial canal with the limbs extended towards the lower uterine segment. Simple appearing 2.4 cm cyst present in the left adnexa. Other: Heterogenous multiloculated collections extending along the hepatic capsule right posterior abdominal wall and the iliacus musculature. Musculoskeletal: Febrile loculated collection is seen involving the superior aspect of the iliacus musculature (3/54) measuring approximately 5.5 x 3.6 by up to 5 cm in craniocaudal extension. Some associated asymmetric thickening of the iliacus may reflect some associated myositis. No associated osseous erosion or destructive changes to suggest associated osteomyelitis at this time within the limitations of CT imaging. Multilevel degenerative changes are present in the imaged portions of the spine. Multilevel flowing anterior osteophytosis, compatible with features of diffuse idiopathic skeletal hyperostosis (DISH). IMPRESSION: 1. Postsurgical changes from cholecystectomy with heterogenous multiloculated collections extending along the hepatic capsule, right posterior abdominal wall as well as a separate collection along the iliacus musculature with associated myositis. Appearance is concerning for postsurgical abscesses. A pigtail pleural drain is positioned at the level of the ninth interspace likely superficial to the predominant multiloculated collection along hepatic surface. 2. Malpositioned radiopaque IUD with the limbs extended towards the lower uterine segment. 3. 2.3 cm cyst in the left adnexa. Normal ovarian tissue is not well visualized. Could reflect a lymphocele in the setting of salpingooophorectomy, consider nonemergent ultrasound. 4. Fecalization of the distal small bowel contents without evidence of mechanical obstruction, may represent slowed intestinal transit. 5. Asymmetrically delayed left nephrogram with some bilateral perinephric  stranding, nonspecific, but can be seen with ascending urinary tract infection. Correlate with urinalysis. 6. Aortic Atherosclerosis (ICD10-I70.0). These results were called by telephone at the time of interpretation on 02/12/2020 at 4:14 am to provider [facility] , who verbally acknowledged these results. Electronically Signed   By: Kreg Shropshire M.D.   On: 02/12/2020 04:14   CT IMAGE GUIDED DRAINAGE BY PERCUTANEOUS CATHETER  Result Date: 02/13/2020 INDICATION: 64 year old female with a history of multiple fluid collections suspicious for abscess EXAM: CT GUIDED DRAINAGE OF  ABSCESS MEDICATIONS: The patient is currently admitted to the hospital and receiving intravenous antibiotics. The antibiotics were administered within an appropriate time frame prior to the initiation of the procedure. ANESTHESIA/SEDATION: 2.0 mg IV Versed 100 mcg IV Fentanyl Moderate Sedation Time:  14 minutes The patient was continuously monitored during the procedure by the interventional radiology nurse under my direct supervision. COMPLICATIONS: None TECHNIQUE: Informed written consent was obtained from the patient after a thorough discussion of the procedural risks, benefits and alternatives. All questions were addressed. Maximal Sterile Barrier Technique was utilized including caps, mask, sterile  gowns, sterile gloves, sterile drape, hand hygiene and skin antiseptic. A timeout was performed prior to the initiation of the procedure. PROCEDURE: The right upper abdomen and the right lower abdomen were prepped with chlorhexidine in a sterile fashion, and a sterile drape was applied covering the operative field. A sterile gown and sterile gloves were used for the procedure. Local anesthesia was provided with 1% Lidocaine. Using CT guidance, trocar needle was advanced into the perihepatic fluid collection. Once we confirmed needle tip position, modified Seldinger technique was used to place a 10 [place] drain. Sample of purulent fluid was  sent for culture. We then turned our attention to the lower collection. CT guidance was used to place a trocar needle into the fluid collection. Once we confirmed needle tip position, modified Seldinger technique was used to place a 10 [place] drain. Sample appearance fluid was sent for culture. Both drains were then sutured in position and attached to bulb suction. Final CT was acquired. Patient tolerated the procedure well and remained hemodynamically stable throughout. No complications were encountered and no significant blood loss. FINDINGS: CT demonstrates fluid collection within the right upper quadrant/perihepatic, with the final image demonstrating well-formed drainage catheter within the collection. CT also demonstrates a lower fluid collection intimately associated with the abdominal wall musculature. Final CT demonstrates pigtail catheter formed well within this collection. IMPRESSION: Status post CT-guided drainage of perihepatic fluid collection and right lower quadrant fluid collection, both with 10 French drainage to bulb suction. Signed, Yvone NeuJaime S. Reyne DumasWagner, DO, RPVI [facility] Electronically Signed   By: Gilmer MorJaime  Wagner D.O.   On: 02/13/2020 12:02   CT IMAGE GUIDED DRAINAGE BY PERCUTANEOUS CATHETER  Result Date: 02/13/2020 INDICATION: 64 year old female with a history of multiple fluid collections suspicious for abscess EXAM: CT GUIDED DRAINAGE OF  ABSCESS MEDICATIONS: The patient is currently admitted to the hospital and receiving intravenous antibiotics. The antibiotics were administered within an appropriate time frame prior to the initiation of the procedure. ANESTHESIA/SEDATION: 2.0 mg IV Versed 100 mcg IV Fentanyl Moderate Sedation Time:  14 minutes The patient was continuously monitored during the procedure by the interventional radiology nurse under my direct supervision. COMPLICATIONS: None TECHNIQUE: Informed written consent was  obtained from the patient after a thorough discussion of the procedural risks, benefits and alternatives. All questions were addressed. Maximal Sterile Barrier Technique was utilized including caps, mask, sterile gowns, sterile gloves, sterile drape, hand hygiene and skin antiseptic. A timeout was performed prior to the initiation of the procedure. PROCEDURE: The right upper abdomen and the right lower abdomen were prepped with chlorhexidine in a sterile fashion, and a sterile drape was applied covering the operative field. A sterile gown and sterile gloves were used for the procedure. Local anesthesia was provided with 1% Lidocaine. Using CT guidance, trocar needle was advanced into the perihepatic fluid collection. Once we confirmed needle tip position, modified Seldinger technique was used to place a 10 [place] drain. Sample of purulent fluid was sent for culture. We then turned our attention to the lower collection. CT guidance was used to place a trocar needle into the fluid collection. Once we confirmed needle tip position, modified Seldinger technique was used to place a 10 [place] drain. Sample appearance fluid was sent for culture. Both drains were then sutured in position and attached to bulb suction. Final CT was acquired. Patient tolerated the procedure well and remained hemodynamically stable throughout. No complications were encountered and no significant blood loss. FINDINGS: CT demonstrates  fluid collection within the right upper quadrant/perihepatic, with the final image demonstrating well-formed drainage catheter within the collection. CT also demonstrates a lower fluid collection intimately associated with the abdominal wall musculature. Final CT demonstrates pigtail catheter formed well within this collection. IMPRESSION: Status post CT-guided drainage of perihepatic fluid collection and right lower quadrant fluid collection, both with 10 French drainage to bulb suction. Signed, Yvone Neu. Reyne Dumas, RPVI [facility] Electronically Signed   By: Gilmer Mor D.O.   On: 02/13/2020 12:02     Scheduled Meds: . aspirin EC  81 mg Oral Daily  . atorvastatin  40 mg Oral Daily  . DULoxetine  60 mg Oral BID  . gabapentin  900 mg Oral TID  . insulin aspart  0-9 Units Subcutaneous Q4H  . insulin glargine  15 Units Subcutaneous BID  . multivitamin with minerals  1 tablet Oral Daily  . omega-3 acid ethyl esters  1,000 mg Oral Daily  . pantoprazole  40 mg Oral Daily  . sodium chloride flush  5 mL Intracatheter Q8H   Continuous Infusions: . piperacillin-tazobactam (ZOSYN)  IV 3.375 g (02/13/20 2040)     LOS: 2 days     Darlin Priestly, MD [facility] If 7PM-7AM, please contact night-coverage 02/14/2020, 12:41 AM

## 2020-02-13 NOTE — Progress Notes (Signed)
Patient clinically stable post x2 Drain placement 10FR right lateral abdomen per Dr Loreta Ave, denies complaints at this time. Awake/alert and oriented post procedure. Received Versed 2mg  along with Fentanyl 100 mcg IV for procedure. Report given to [facility] Rn on 2C with questions answered. Vitals stable.

## 2020-02-13 NOTE — Evaluation (Signed)
Physical Therapy Evaluation Patient Details Name: April Burton MRN: [id number] DOB: 10/10/1955 Today's Date: 02/13/2020   History of Present Illness  Pt  is a 64 y.o. female with medical history significant for HTN, DM, chronic pain on Suboxone, anemia, depression, and GERD. Per MD impression, pt currently presents with: sepsis due to peritoneal abcess s/p 8/6 JP drain insertion, UTI, acute metabolic encephalopathy, DM2, HTN, depression, buprenorphine dependence, and normocytic anemia.  Clinical Impression  Pt was pleasant and motivated to participate during the session. Pt found sitting in chair, A&Ox3 lacking orientation to place; pt appeared to be generally mildly confused throughout session. At baseline, SpO2 94% and HR 86bpm. Pt able to perform sit-to-stand w/ inc time but did not appear to have any difficulty. Pt ambulated 254ft w/ RW and supervision; pt demonstrated normal gait pattern but rather slow speed. SpO2 97% and HR 101bpm at end of ambulation. Pt could use further training for improved mobility and ambulation tolerance. Pt maintained only 1 hand on RW 50% of the time; no LOB noted. Pt appeared somewhat impulsive w/ RW use throughout ambulation and could use further training w/ DME management. Pt will benefit from HHPT services upon discharge to safely address deficits listed in patient problem list for decreased caregiver assistance and eventual return to PLOF.     Follow Up Recommendations Home health PT;Supervision for mobility/OOB (pt states currently recieving HHPT)    Equipment Recommendations  None recommended by PT    Recommendations for Other Services       Precautions / Restrictions Precautions Precautions: Fall Restrictions Weight Bearing Restrictions: No      Mobility  Bed Mobility               General bed mobility comments: deferred, up in recliner  Transfers Overall transfer level: Modified independent Equipment used: Rolling walker (2  wheeled)             General transfer comment: STS without difficulty  Ambulation/Gait Ambulation/Gait assistance: Supervision Gait Distance (Feet): 200 Feet Assistive device: Rolling walker (2 wheeled) Gait Pattern/deviations: WFL(Within Functional Limits)     General Gait Details: Pt ambulated with a rather slow gait speed. Steady on feet throughout ambulation. minimal use of RW (only had 1 UE on it for 50% of the time).  Stairs            Wheelchair Mobility    Modified Rankin (Stroke Patients Only)       Balance Overall balance assessment: Mild deficits observed, not formally tested                                           Pertinent Vitals/Pain Pain Assessment: Faces Faces Pain Scale: Hurts a little bit Pain Location: abdomen Pain Descriptors / Indicators: Aching Pain Intervention(s): Limited activity within patient's tolerance;Monitored during session;Repositioned    Home Living Family/patient expects to be discharged to:: Private residence Living Arrangements: Children (daughter) Available Help at Discharge: Family;Available 24 hours/day Type of Home: Apartment Home Access: Level entry     Home Layout: One level Home Equipment: Walker - 2 wheels;Bedside commode;Cane - single point      Prior Function Level of Independence: Independent         Comments: Per pt, independent w/ ADL/IADLs. Lives with daughter who pt states has "her own issues" and they are back and fourth with who is dominant w/ household chorse,  etc. Pt states able to walk community distances w/ SPC, occasional use of RW. Pt states one fall in past year and states she feels it was medicine induced.     Hand Dominance   Dominant Hand: Right    Extremity/Trunk Assessment   Upper Extremity Assessment Upper Extremity Assessment: Overall WFL for tasks assessed    Lower Extremity Assessment Lower Extremity Assessment: Overall WFL for tasks assessed        Communication   Communication: No difficulties  Cognition Arousal/Alertness: Awake/alert Behavior During Therapy: WFL for tasks assessed/performed Overall Cognitive Status: No family/caregiver present to determine baseline cognitive functioning                                 General Comments: A&Ox3; not oriented to place but after time was able to remember why she is here. Follows commands well, some general confusion noted throughout session.      General Comments      Exercises     Assessment/Plan    PT Assessment Patient needs continued PT services  PT Problem List Decreased strength;Decreased range of motion;Decreased mobility;Decreased activity tolerance;Decreased balance;Decreased knowledge of use of DME;Pain       PT Treatment Interventions DME instruction;Gait training;Stair training;Functional mobility training;Therapeutic activities;Patient/family education;Balance training;Therapeutic exercise    PT Goals (Current goals can be found in the Care Plan section)  Acute Rehab PT Goals Patient Stated Goal: Walk better and get stronger PT Goal Formulation: With patient Time For Goal Achievement: 02/26/20 Potential to Achieve Goals: Good    Frequency Min 2X/week   Barriers to discharge        Co-evaluation               AM-PAC PT "6 Clicks" Mobility  Outcome Measure Help needed turning from your back to your side while in a flat bed without using bedrails?: A Little Help needed moving from lying on your back to sitting on the side of a flat bed without using bedrails?: A Little Help needed moving to and from a bed to a chair (including a wheelchair)?: None Help needed standing up from a chair using your arms (e.g., wheelchair or bedside chair)?: None Help needed to walk in hospital room?: None Help needed climbing 3-5 steps with a railing? : A Little 6 Click Score: 21    End of Session Equipment Utilized During Treatment: Gait  belt Activity Tolerance: Patient tolerated treatment well Patient left: in chair;with call bell/phone within reach;Other (comment) (no chair alarm in room) Nurse Communication: Mobility status;Other (comment) (no chair alarm) PT Visit Diagnosis: Muscle weakness (generalized) (M62.81);Unsteadiness on feet (R26.81);Difficulty in walking, not elsewhere classified (R26.2)    Time: 7867-5449 PT Time Calculation (min) (ACUTE ONLY): 27 min   Charges:              Thereasa Distance SPT 02/13/20, 5:26 PM

## 2020-02-13 NOTE — Procedures (Signed)
Interventional Radiology Procedure Note  Procedure: Image guided drain placement, perihepatic.  70F pigtail drain. Image guided drain placement, right pelvic.  70F pigtail drain.  Removal of displaced drain, RUQ.   Complications: None  EBL: None Sample: Culture sent from both collection  Recommendations: - Routine drain care, with sterile flushes, record output - follow up Cx - routine wound care  Signed,  Yvone Neu. Loreta Ave, DO

## 2020-02-14 LAB — CBC
HCT: 27.5 % — ABNORMAL LOW (ref 36.0–46.0)
Hemoglobin: 8.5 g/dL — ABNORMAL LOW (ref 12.0–15.0)
MCH: 27.7 pg (ref 26.0–34.0)
MCHC: 30.9 g/dL (ref 30.0–36.0)
MCV: 89.6 fL (ref 80.0–100.0)
Platelets: 497 10*3/uL — ABNORMAL HIGH (ref 150–400)
RBC: 3.07 MIL/uL — ABNORMAL LOW (ref 3.87–5.11)
RDW: 14.6 % (ref 11.5–15.5)
WBC: 10.3 10*3/uL (ref 4.0–10.5)
nRBC: 0 % (ref 0.0–0.2)

## 2020-02-14 LAB — GLUCOSE, CAPILLARY
Glucose-Capillary: 234 mg/dL — ABNORMAL HIGH (ref 70–99)
Glucose-Capillary: 242 mg/dL — ABNORMAL HIGH (ref 70–99)
Glucose-Capillary: 263 mg/dL — ABNORMAL HIGH (ref 70–99)
Glucose-Capillary: 291 mg/dL — ABNORMAL HIGH (ref 70–99)
Glucose-Capillary: 315 mg/dL — ABNORMAL HIGH (ref 70–99)
Glucose-Capillary: 330 mg/dL — ABNORMAL HIGH (ref 70–99)
Glucose-Capillary: 350 mg/dL — ABNORMAL HIGH (ref 70–99)

## 2020-02-14 LAB — BASIC METABOLIC PANEL
Anion gap: 11 (ref 5–15)
BUN: 8 mg/dL (ref 8–23)
CO2: 30 mmol/L (ref 22–32)
Calcium: 9.2 mg/dL (ref 8.9–10.3)
Chloride: 97 mmol/L — ABNORMAL LOW (ref 98–111)
Creatinine, Ser: 0.54 mg/dL (ref 0.44–1.00)
GFR calc Af Amer: >60 mL/min (ref >60)
GFR calc non Af Amer: >60 mL/min (ref >60)
Glucose, Bld: 249 mg/dL — ABNORMAL HIGH (ref 70–99)
Potassium: 3.5 mmol/L (ref 3.5–5.1)
Sodium: 138 mmol/L (ref 135–145)

## 2020-02-14 LAB — MAGNESIUM: Magnesium: 1.4 mg/dL — ABNORMAL LOW (ref 1.7–2.4)

## 2020-02-14 MED ORDER — MORPHINE SULFATE 15 MG PO TABS
30.0000 mg | ORAL_TABLET | Freq: Four times a day (QID) | ORAL | Status: DC | PRN
  Administered 2020-02-14 – 2020-02-15 (×3): 30 mg via ORAL
  Filled 2020-02-14 (×3): qty 2

## 2020-02-14 NOTE — Progress Notes (Signed)
Inpatient Diabetes Program Recommendations  AACE/ADA: New Consensus Statement on Inpatient Glycemic Control (2015)  Target Ranges:  Prepandial:   less than 140 mg/dL      Peak postprandial:   less than 180 mg/dL (1-2 hours)      Critically ill patients:  140 - 180 mg/dL   Results for April Burton, April Burton (MRN [id number]) as of 02/14/2020 09:37  Ref. Range 02/13/2020 00:51 02/13/2020 04:28 02/13/2020 08:37 02/13/2020 11:51 02/13/2020 17:02 02/13/2020 20:28  Glucose-Capillary Latest Ref Range: 70 - 99 mg/dL 735 (H)  3 units NOVOLOG +  15 units LANTUS  211 (H)  3 units NOVOLOG  197 (H)  2 units NOVOLOG  222 (H)  3 units NOVOLOG  303 (H)  7 units NOVOLOG  340 (H)  7 units NOVOLOG +  15 units LANTUS   Results for April Burton, April Burton (MRN [id number]) as of 02/14/2020 09:37  Ref. Range 02/14/2020 00:01 02/14/2020 04:50 02/14/2020 07:53  Glucose-Capillary Latest Ref Range: 70 - 99 mg/dL 341 (H)  7 units NOVOLOG  234 (H)  3 units NOVOLOG  242 (H)     Home DM Meds: Lantus 20 units BID       Humalog 35 units TID       Metformin 1000 mg BID       Ozempic 1 mg Qweek   Current Orders: Lantus 15 units BID      Novolog Sensitive Correction Scale/ SSI (0-9 units) Q4 hours     MD- Please note that patient did NOT receive her 10am dose of Lantus yesterday AM (per MAR record, dose HELD b/c pt was off floor)  Scheduled to get both doses Lantus today  May consider increasing Novolog SSI to the Moderate scale (0-15 units) Q4 hours     --Will follow patient during hospitalization--  Ambrose Finland RN, MSN, CDE Diabetes Coordinator Inpatient Glycemic Control Team Team Pager: [phone] (8a-5p)

## 2020-02-14 NOTE — TOC Progression Note (Signed)
Transition of Care [facility]) - Progression Note    Patient Details  Name: April Burton MRN: [id number] Date of Birth: [birth year]  [facility] CM/SW Contact  Chapman Fitch, RN Phone Number: 02/14/2020, 9:41 AM  Clinical Narrative:    Open with [facility] home health for RN and PT        Expected Discharge Plan and Services                                                 Social Determinants of Health (SDOH) Interventions    Readmission Risk Interventions No flowsheet data found.

## 2020-02-14 NOTE — Progress Notes (Signed)
Mobility Specialist - Progress Note   02/14/20 1133  Mobility  Activity Ambulated in room;Ambulated in hall  Level of Assistance Modified independent, requires aide device or extra time  Assistive Device Front wheel walker  Distance Ambulated (ft) 260 ft  Mobility Response Tolerated well  Mobility performed by Mobility specialist  $Mobility charge 1 Mobility     Pre-mobility: 67 HR, 105/52 BP, 100% SpO2 Post-mobility: 72 HR, 130/44 BP, 100% SpO2   Pt was sitting in recliner upon arrival. Pt agreed to mobility. Pt stated she was having pain at R abdominal region (drainage location), rating pain 8/10. However, pt still willing to participate. Pt was mod. Independent w/ S2S. Pt ambulated 260' total in room and hallway mod. Independently using a RW. CGA utilized for safety precautions. At the end of session, pt noted that the pain she had initially has increased. Nurse was notified. Overall, pt tolerated session well. Pt able to maintain steady gait t/o session. Pt left in recliner w/ call bell and phone placed in reach.    Suraiya Dickerson Mobility Specialist  02/14/20, 11:41 AM

## 2020-02-14 NOTE — Progress Notes (Signed)
PROGRESS NOTE    April Burton  RUE:[id number] DOB: April 15, 1956 DOA: 02/12/2020 [place]CP: [place], [facility]    Assessment & Plan:   Principal Problem:   Peritoneal abscess [facility](HCC) Active Problems:   UTI (urinary tract infection)   Acute metabolic encephalopathy   Type 2 diabetes mellitus without complication (HCC)   HTN (hypertension)   Depression   Buprenorphine dependence (HCC)   Normocytic anemia   GERD (gastroesophageal reflux disease)   Sepsis (HCC)    Sepsis due to peritoneal abscess [facility](HCC): JP drain is not working.  Patient meets criteria for sepsis with leukocytosis, tachycardia.  Lactic acid is normal.  Procalcitonin 0.54. --IR placed 2 drains on 8/18 PLAN: --continue zosyn --trend procal --increase oral morphine for pain  UTI (urinary tract infection):  Patient was recently admitted and treated for UTI/pyelonephritis.  Patient still has burning on urination.  Still has positive urine analysis --currently on zosyn as above --f/u urine cx  Acute metabolic encephalopathy: Seem to have resolved.  Patient currently is alert, oriented x3.  No focal neuro deficit on physical examination. -Hold off Suboxone per her daughter  Type 2 diabetes mellitus without complication [facility](HCC): Recent A1c 8.6, poorly controlled.  Patient is taking semaglutide, Metformin, Humalog, Lantus at home --continue Lantus 15u BID -Sliding scale insulin  HTN (hypertension) -Hold home atenolol, enalapril and spironolactone for now due to intermittent soft BP  Depression -Continue home medications  Buprenorphine dependence (HCC): Per her daughter, Suboxone makes patient too drowsy and was stopped at the day before yesterday by her daughter. -Hold off Suboxone per her daughter.  Normocytic anemia, iron def : Hemoglobin stable.  8.7 on 01/23/2020 --> 9.3 -Follow-up with CBC --start iron supplement  GERD (gastroesophageal reflux disease) -Protonix   DVT prophylaxis:  SCD/Compression stockings Code Status: Full code  Family Communication:  Status is: inpatient Dispo:   The patient is from: home An[facility] Anticipated d/c date is: 2 days Patient currently is not medically stable to d/c due to: on zosyn currently for abdominal abscess   Subjective and Interval History:  Pt complained of abdominal pain and asked for increase in pain meds.  Eating better now.   Objective: Vitals:   02/13/20 1149 02/13/20 1949 02/14/20 0452 02/14/20 1205  BP: (!) 134/51 (!) 118/48 111/64 (!) 104/51  Pulse: 80 71 71 70  Resp: 18 20 18 20   Temp: 97.9 F (36.6 C) 97.6 F (36.4 C) 97.9 F (36.6 C) 97.9 F (36.6 C)  TempSrc: Oral  Oral Oral  SpO2: 96% 95% 96%   Weight:      Height:        Intake/Output Summary (Last 24 hours) at 02/14/2020 1850 Last data filed at 02/14/2020 1726 Gross per 24 hour  Intake 1086.82 ml  Output 85 ml  Net 1001.82 ml   Filed Weights   02/11/20 2153  Weight: 99.8 kg    Examination:   Constitutional: NAD, AAOx3 HEENT: conjunctivae and lids normal, EOMI CV: RRR no M,R,G. Distal pulses +2.  No cyanosis.   RESP: CTA B/L, normal respiratory effort  GI: +BS, ND, 1 drain outputting purulent material, 1 drain outputting serosanguinous material Extremities: No effusions, edema, or tenderness in BLE SKIN: warm, dry and intact Neuro: II - XII grossly intact.  Sensation intact Psych: Normal mood and affect.     Data Reviewed: I have personally reviewed following labs and imaging studies  CBC: Recent Labs  Lab 02/11/20 2213 02/13/20 0525 02/14/20 [id number]  WBC 20.4* 16.9* 10.3  NEUTROABS 16.7*  --   --   HGB 9.3* 8.0* 8.5*  HCT 29.1* 24.3* 27.5*  MCV 86.9 85.0 89.6  PLT 535* 476* 497*   Basic Metabolic Panel: Recent Labs  Lab 02/11/20 2213 02/13/20 0525 02/14/20 0452  NA 134* 135 138  K 3.7 3.3* 3.5  CL 91* 93* 97*  CO2 GLUCOSE 208* 221* 249*  BUN CREATININE 0.63 0.62 0.54   CALCIUM 9.6 9.3 9.2  MG  --   --  1.4*   GFR: Estimated Creatinine Clearance: 83.1 mL/min (by C-G formula based on SCr of 0.54 mg/dL). Liver Function Tests: Recent Labs  Lab 02/11/20 2213  AST 15  ALT 11  ALKPHOS 86  BILITOT 0.8  PROT 7.3  ALBUMIN 2.8*   No results for input(s): LIPASE, AMYLASE in the last 168 hours. No results for input(s): AMMONIA in the last 168 hours. Coagulation Profile: Recent Labs  Lab 02/12/20 0928  INR 1.3*   Cardiac Enzymes: No results for input(s): CKTOTAL, CKMB, CKMBINDEX, TROPONINI in the last 168 hours. BNP (last 3 results) No results for input(s): PROBNP in the last 8760 hours. HbA1C: No results for input(s): HGBA1C in the last 72 hours. CBG: Recent Labs  Lab 02/14/20 0001 02/14/20 0450 02/14/20 0753 02/14/20 1128 02/14/20 1635  GLUCAP 330* 234* 242* 291* 315*   Lipid Profile: No results for input(s): CHOL, HDL, LDLCALC, TRIG, CHOLHDL, LDLDIRECT in the last 72 hours. Thyroid Function Tests: No results for input(s): TSH, T4TOTAL, FREET4, T3FREE, THYROIDAB in the last 72 hours. Anemia Panel: No results for input(s): VITAMINB12, FOLATE, FERRITIN, TIBC, IRON, RETICCTPCT in the last 72 hours. Sepsis Labs: Recent Labs  Lab 02/11/20 2213  PROCALCITON 0.54  LATICACIDVEN 1.7    Recent Results (from the past 240 hour(s))  Urine Culture     Status: None   Collection Time: 02/11/20 10:13 PM   Specimen: Urine, Random  Result Value Ref Range Status   Specimen Description   Final    URINE, RANDOM Performed at [facility], [address], [place], [place] [address]    Special Requests   Final    Normal Performed at [facility], [address] [place], [place] [address]    Culture   Final    NO GROWTH Performed at [facility], [address], [place], [place] [address]    Report Status 02/12/2020 FINAL  Final  Blood culture (routine x 2)     Status: None (Preliminary result)   Collection Time:  02/12/20  4:20 AM   Specimen: BLOOD  Result Value Ref Range Status   Specimen Description BLOOD RIGHT FA  Final   Special Requests   Final    BOTTLES DRAWN AEROBIC AND ANAEROBIC Blood Culture results may not be optimal due to an inadequate volume of blood received in culture bottles   Culture   Final    NO GROWTH 2 DAYS Performed at [facility], [address] [place], [place] [address]    Report Status PENDING  Incomplete  Blood culture (routine x 2)     Status: None (Preliminary result)   Collection Time: 02/12/20  4:20 AM   Specimen: BLOOD  Result Value Ref Range Status   Specimen Description BLOOD LEFT FA  Final   Special Requests   Final    BOTTLES DRAWN AEROBIC AND ANAEROBIC Blood Culture adequate volume   Culture   Final  NO GROWTH 2 DAYS Performed at [facility], [address] [place], [place] [address]    Report Status PENDING  Incomplete  SARS Coronavirus 2 by RT PCR (hospital order, performed in [facility] hospital lab) Nasopharyngeal Nasopharyngeal Swab     Status: None   Collection Time: 02/12/20  4:27 AM   Specimen: Nasopharyngeal Swab  Result Value Ref Range Status   SARS Coronavirus 2 NEGATIVE NEGATIVE Final    Comment: (NOTE) SARS-CoV-2 target nucleic acids are NOT DETECTED.  The SARS-CoV-2 RNA is generally detectable in upper and lower respiratory specimens during the acute phase of infection. The lowest concentration of SARS-CoV-2 viral copies this assay can detect is 250 copies / mL. A negative result does not preclude SARS-CoV-2 infection and should not be used as the sole basis for treatment or other patient management decisions.  A negative result may occur with improper specimen collection / handling, submission of specimen other than nasopharyngeal swab, presence of viral mutation(s) within the areas targeted by this assay, and inadequate number of viral copies (<250 copies / mL). A negative result must be combined with  clinical observations, patient history, and epidemiological information.  Fact Sheet for Patients:   BoilerBrush.com.cy  Fact Sheet for Healthcare Providers: https://pope.com/  This test is not yet approved or  cleared by the [place] FDA and has been authorized for detection and/or diagnosis of SARS-CoV-2 by FDA under an Emergency Use Authorization (EUA).  This EUA will remain in effect (meaning this test can be used) for the duration of the COVID-19 declaration under Section 564(b)(1) of the Act, 21 U.S.C. section 360bbb-3(b)(1), unless the authorization is terminated or revoked sooner.  Performed at [facility], [address] [place], [place] [address]   Aerobic/Anaerobic Culture (surgical/deep wound)     Status: None (Preliminary result)   Collection Time: 02/13/20 10:32 AM   Specimen: Abscess  Result Value Ref Range Status   Specimen Description   Final    ABSCESS RIGHT LOWER PERITONEAL Performed at [facility], [address] [place], [place] [address]    Special Requests   Final    ABSCESS Performed at [facility], [address] [place], [place] [address]    Gram Stain   Final    ABUNDANT WBC PRESENT, PREDOMINANTLY PMN NO ORGANISMS SEEN    Culture   Final    ABUNDANT KLEBSIELLA PNEUMONIAE SUSCEPTIBILITIES TO FOLLOW Performed at [facility], [address] [place], [place] [address]    Report Status PENDING  Incomplete  Aerobic/Anaerobic Culture (surgical/deep wound)     Status: None (Preliminary result)   Collection Time: 02/13/20 10:32 AM   Specimen: Abdomen; Abscess  Result Value Ref Range Status   Specimen Description   Final    ABDOMEN RIGHT UPPER PERITONEAL Performed at [facility], [address], [place], [place] [address]    Special Requests   Final    NONE Performed at [facility], [address] [place], [place] [address]    Gram  Stain   Final    ABUNDANT WBC PRESENT, PREDOMINANTLY PMN NO ORGANISMS SEEN Performed at [facility], [address], [place], [place] [address]    Culture RARE GRAM NEGATIVE RODS  Final   Report Status PENDING  Incomplete      Radiology Studies: CT IMAGE GUIDED DRAINAGE BY PERCUTANEOUS CATHETER  Result Date: 02/13/2020 INDICATION: 64 year old female with a history of multiple fluid collections suspicious for abscess  EXAM: CT GUIDED DRAINAGE OF  ABSCESS MEDICATIONS: The patient is currently admitted to the hospital and receiving intravenous antibiotics. The antibiotics were administered within an appropriate time frame prior to the initiation of the procedure. ANESTHESIA/SEDATION: 2.0 mg IV Versed 100 mcg IV Fentanyl Moderate Sedation Time:  14 minutes The patient was continuously monitored during the procedure by the interventional radiology nurse under my direct supervision. COMPLICATIONS: None TECHNIQUE: Informed written consent was obtained from the patient after a thorough discussion of the procedural risks, benefits and alternatives. All questions were addressed. Maximal Sterile Barrier Technique was utilized including caps, mask, sterile gowns, sterile gloves, sterile drape, hand hygiene and skin antiseptic. A timeout was performed prior to the initiation of the procedure. PROCEDURE: The right upper abdomen and the right lower abdomen were prepped with chlorhexidine in a sterile fashion, and a sterile drape was applied covering the operative field. A sterile gown and sterile gloves were used for the procedure. Local anesthesia was provided with 1% Lidocaine. Using CT guidance, trocar needle was advanced into the perihepatic fluid collection. Once we confirmed needle tip position, modified Seldinger technique was used to place a 10 [place] drain. Sample of purulent fluid was sent for culture. We then turned our attention to the lower collection. CT guidance was used to place a trocar needle  into the fluid collection. Once we confirmed needle tip position, modified Seldinger technique was used to place a 10 [place] drain. Sample appearance fluid was sent for culture. Both drains were then sutured in position and attached to bulb suction. Final CT was acquired. Patient tolerated the procedure well and remained hemodynamically stable throughout. No complications were encountered and no significant blood loss. FINDINGS: CT demonstrates fluid collection within the right upper quadrant/perihepatic, with the final image demonstrating well-formed drainage catheter within the collection. CT also demonstrates a lower fluid collection intimately associated with the abdominal wall musculature. Final CT demonstrates pigtail catheter formed well within this collection. IMPRESSION: Status post CT-guided drainage of perihepatic fluid collection and right lower quadrant fluid collection, both with 10 French drainage to bulb suction. Signed, Yvone Neu. Reyne Dumas, RPVI [facility] Radiology Electronically Signed   By: Gilmer Mor D.O.   On: 02/13/2020 12:02   CT IMAGE GUIDED DRAINAGE BY PERCUTANEOUS CATHETER  Result Date: 02/13/2020 INDICATION: 64 year old female with a history of multiple fluid collections suspicious for abscess EXAM: CT GUIDED DRAINAGE OF  ABSCESS MEDICATIONS: The patient is currently admitted to the hospital and receiving intravenous antibiotics. The antibiotics were administered within an appropriate time frame prior to the initiation of the procedure. ANESTHESIA/SEDATION: 2.0 mg IV Versed 100 mcg IV Fentanyl Moderate Sedation Time:  14 minutes The patient was continuously monitored during the procedure by the interventional radiology nurse under my direct supervision. COMPLICATIONS: None TECHNIQUE: Informed written consent was obtained from the patient after a thorough discussion of the procedural risks, benefits and alternatives. All questions  were addressed. Maximal Sterile Barrier Technique was utilized including caps, mask, sterile gowns, sterile gloves, sterile drape, hand hygiene and skin antiseptic. A timeout was performed prior to the initiation of the procedure. PROCEDURE: The right upper abdomen and the right lower abdomen were prepped with chlorhexidine in a sterile fashion, and a sterile drape was applied covering the operative field. A sterile gown and sterile gloves were used for the procedure. Local anesthesia was provided with 1% Lidocaine. Using CT guidance, trocar needle was advanced into the perihepatic fluid collection. Once we confirmed needle tip  position, modified Seldinger technique was used to place a 10 [place] drain. Sample of purulent fluid was sent for culture. We then turned our attention to the lower collection. CT guidance was used to place a trocar needle into the fluid collection. Once we confirmed needle tip position, modified Seldinger technique was used to place a 10 [place] drain. Sample appearance fluid was sent for culture. Both drains were then sutured in position and attached to bulb suction. Final CT was acquired. Patient tolerated the procedure well and remained hemodynamically stable throughout. No complications were encountered and no significant blood loss. FINDINGS: CT demonstrates fluid collection within the right upper quadrant/perihepatic, with the final image demonstrating well-formed drainage catheter within the collection. CT also demonstrates a lower fluid collection intimately associated with the abdominal wall musculature. Final CT demonstrates pigtail catheter formed well within this collection. IMPRESSION: Status post CT-guided drainage of perihepatic fluid collection and right lower quadrant fluid collection, both with 10 French drainage to bulb suction. Signed, Yvone Neu. Reyne Dumas, RPVI [facility] Radiology Electronically Signed   By: Gilmer Mor  D.O.   On: 02/13/2020 12:02     Scheduled Meds: . aspirin EC  81 mg Oral Daily  . atorvastatin  40 mg Oral Daily  . DULoxetine  60 mg Oral BID  . gabapentin  900 mg Oral TID  . insulin aspart  0-9 Units Subcutaneous Q4H  . insulin glargine  15 Units Subcutaneous BID  . multivitamin with minerals  1 tablet Oral Daily  . omega-3 acid ethyl esters  1,000 mg Oral Daily  . pantoprazole  40 mg Oral Daily  . sodium chloride flush  5 mL Intracatheter Q8H   Continuous Infusions: . piperacillin-tazobactam (ZOSYN)  IV 3.375 g (02/14/20 1809)     LOS: 2 days     Darlin Priestly, MD [facility] If 7PM-7AM, please contact night-coverage 02/14/2020, 6:50 PM

## 2020-02-15 LAB — GLUCOSE, CAPILLARY
Glucose-Capillary: 184 mg/dL — ABNORMAL HIGH (ref 70–99)
Glucose-Capillary: 161 mg/dL — ABNORMAL HIGH (ref 70–99)
Glucose-Capillary: 223 mg/dL — ABNORMAL HIGH (ref 70–99)
Glucose-Capillary: 221 mg/dL — ABNORMAL HIGH (ref 70–99)
Glucose-Capillary: 214 mg/dL — ABNORMAL HIGH (ref 70–99)

## 2020-02-15 LAB — CBC
HCT: 25.1 % — ABNORMAL LOW (ref 36.0–46.0)
Hemoglobin: 8.1 g/dL — ABNORMAL LOW (ref 12.0–15.0)
MCH: 27.8 pg (ref 26.0–34.0)
MCHC: 32.3 g/dL (ref 30.0–36.0)
MCV: 86.3 fL (ref 80.0–100.0)
Platelets: 533 10*3/uL — ABNORMAL HIGH (ref 150–400)
RBC: 2.91 MIL/uL — ABNORMAL LOW (ref 3.87–5.11)
RDW: 14.5 % (ref 11.5–15.5)
WBC: 8.8 10*3/uL (ref 4.0–10.5)
nRBC: 0 % (ref 0.0–0.2)

## 2020-02-15 LAB — BASIC METABOLIC PANEL
Anion gap: 8 (ref 5–15)
BUN: 7 mg/dL — ABNORMAL LOW (ref 8–23)
CO2: 31 mmol/L (ref 22–32)
Calcium: 8.8 mg/dL — ABNORMAL LOW (ref 8.9–10.3)
Chloride: 100 mmol/L (ref 98–111)
Creatinine, Ser: 0.53 mg/dL (ref 0.44–1.00)
GFR calc Af Amer: >60 mL/min (ref >60)
GFR calc non Af Amer: >60 mL/min (ref >60)
Glucose, Bld: 167 mg/dL — ABNORMAL HIGH (ref 70–99)
Potassium: 3.4 mmol/L — ABNORMAL LOW (ref 3.5–5.1)
Sodium: 139 mmol/L (ref 135–145)

## 2020-02-15 LAB — MAGNESIUM: Magnesium: 1.2 mg/dL — ABNORMAL LOW (ref 1.7–2.4)

## 2020-02-15 MED ORDER — MAGNESIUM SULFATE 2 GM/50ML IV SOLN
2.0000 g | INTRAVENOUS | Status: AC
  Administered 2020-02-15 (×2): 2 g via INTRAVENOUS
  Filled 2020-02-15 (×2): qty 50

## 2020-02-15 MED ORDER — CIPROFLOXACIN HCL 500 MG PO TABS
500.0000 mg | ORAL_TABLET | Freq: Two times a day (BID) | ORAL | Status: DC
  Administered 2020-02-15 – 2020-02-16 (×2): 500 mg via ORAL
  Filled 2020-02-15 (×2): qty 1

## 2020-02-15 MED ORDER — POTASSIUM CHLORIDE CRYS ER 20 MEQ PO TBCR
40.0000 meq | EXTENDED_RELEASE_TABLET | Freq: Once | ORAL | Status: AC
  Administered 2020-02-15: 40 meq via ORAL
  Filled 2020-02-15: qty 2

## 2020-02-15 MED ORDER — FERROUS SULFATE 325 (65 FE) MG PO TABS
325.0000 mg | ORAL_TABLET | Freq: Every day | ORAL | Status: DC
  Administered 2020-02-15 – 2020-02-16 (×2): 325 mg via ORAL
  Filled 2020-02-15 (×2): qty 1

## 2020-02-15 MED ORDER — MORPHINE SULFATE 15 MG PO TABS
15.0000 mg | ORAL_TABLET | Freq: Four times a day (QID) | ORAL | Status: DC | PRN
  Administered 2020-02-15 – 2020-02-16 (×3): 15 mg via ORAL
  Filled 2020-02-15 (×3): qty 1

## 2020-02-15 MED ORDER — INSULIN ASPART 100 UNIT/ML ~~LOC~~ SOLN
0.0000 [IU] | Freq: Three times a day (TID) | SUBCUTANEOUS | Status: DC
  Administered 2020-02-15 – 2020-02-16 (×3): 7 [IU] via SUBCUTANEOUS
  Filled 2020-02-15 (×3): qty 1

## 2020-02-15 NOTE — Progress Notes (Signed)
Inpatient Diabetes Program Recommendations  AACE/ADA: New Consensus Statement on Inpatient Glycemic Control (2015)  Target Ranges:  Prepandial:   less than 140 mg/dL      Peak postprandial:   less than 180 mg/dL (1-2 hours)      Critically ill patients:  140 - 180 mg/dL   Lab Results  Component Value Date   GLUCAP 223 (H) 02/15/2020   HGBA1C 8.6 (H) 01/19/2020    Review of Glycemic Control Results for CRISTIN, PENAFLOR (MRN [id number]) as of 02/15/2020 12:31  Ref. Range 02/14/2020 19:58 02/14/2020 23:07 02/15/2020 03:11 02/15/2020 07:34 02/15/2020 11:44  Glucose-Capillary Latest Ref Range: 70 - 99 mg/dL 297 (H) 989 (H) 211 (H) 161 (H) 223 (H)  Home DM Meds: Lantus 20 units BID                             Humalog 35 units TID                             Metformin 1000 mg BID                             Ozempic 1 mg Qweek   Current Orders: Lantus 15 units BID                            Novolog resistant Correction Scale/ SSI (0-20 units) tid with meals  Inpatient Diabetes Program Recommendations:    Note that Novolog correction increased to resistant scale today.  If blood sugars continue to be increased at 12 noon and 5 pm, consider adding Novolog 3 units tid with meals (hold if patient eats less than 50%).    Thanks,  Beryl Meager, RN, BC-ADM Inpatient Diabetes Coordinator Pager [phone] (8a-5p)

## 2020-02-15 NOTE — Progress Notes (Addendum)
Mobility Specialist - Progress Note   02/15/20 1205  Mobility  Activity Ambulated in room;Ambulated in hall  Level of Assistance Modified independent, requires aide device or extra time  Assistive Device Front wheel walker  Distance Ambulated (ft) 350 ft  Mobility Response Tolerated well  Mobility performed by Mobility specialist  $Mobility charge 1 Mobility    Pre-mobility: 68 HR, 123/57 BP, 100% SpO2 Post-mobility: 69 HR, 132/57 BP, 99% SpO2   Pt was sitting in recliner w/ nurse present in room upon arrival. Pt agreed to mobility session this AM. Pt mod. Independent t/o session. Pt ambulated 350' total in room and hallway using a RW. CGA utilized for safety. Pt did c/o R abdominal pain (where drainage is located) while ambulating, rating pain 5/10. In conclusion, pt tolerated session well. Pt left sitting in her recliner w/ call bell and phone placed in reach.     Elina Streng Mobility Specialist  02/15/20, 12:13 PM

## 2020-02-15 NOTE — TOC Progression Note (Signed)
Transition of Care [facility]) - Progression Note    Patient Details  Name: April Burton MRN: [id number] Date of Birth: [birth year]  Transition of [facility] CM/SW Contact  Chapman Fitch, RN Phone Number: 02/15/2020, 12:30 PM  Clinical Narrative:        Per MD anticipated discharge tomorrow.  To be transitioned to oral antibiotics April Burton with [facility] notified of anticipated discharge     Expected Discharge Plan and Services                                                 Social Determinants of Health (SDOH) Interventions    Readmission Risk Interventions No flowsheet data found.

## 2020-02-15 NOTE — Progress Notes (Addendum)
PROGRESS NOTE    April Burton  QQV:[id number] DOB: 10-11-1955 DOA: 02/12/2020 PCP: [facility]    Assessment & Plan:   Principal Problem:   Peritoneal abscess [facility]) Active Problems:   UTI (urinary tract infection)   Acute metabolic encephalopathy   Type 2 diabetes mellitus without complication (HCC)   HTN (hypertension)   Depression   Buprenorphine dependence (HCC)   Normocytic anemia   GERD (gastroesophageal reflux disease)   Sepsis (HCC)    Sepsis due to peritoneal abscess [facility]): JP drain was not working.  Patient meets criteria for sepsis with leukocytosis, tachycardia.  Lactic acid is normal.  Procalcitonin 0.54.  Started on zosyn on presentation with resolution of sepsis. --IR placed 2 drains on 8/18 --abscess cx grew pan-sensitive Klebsiella PLAN: --transition to oral Cipro tonight --trend procal --oral morphine for pain  UTI (urinary tract infection):  Patient was recently admitted and treated for UTI/pyelonephritis.  Patient still has burning on urination.  Still has positive urine analysis --continue abx as above which also treats UTI  Acute metabolic encephalopathy, resolved.   Patient currently is alert, oriented x3.  No focal neuro deficit on physical examination. -Hold off Suboxone per her daughter  Type 2 diabetes mellitus poorly controlled with hyperglycemia --Recent A1c 8.6.  Patient is taking semaglutide, Metformin, Humalog, Lantus at home --continue Lantus 15u BID -Sliding scale insulin  HTN (hypertension) -Hold home atenolol, enalapril and spironolactone for now due to intermittent soft BP  Depression -Continue home medications  Buprenorphine dependence (HCC): Per her daughter, Suboxone makes patient too drowsy and was stopped at the day before yesterday by her daughter. -Hold off Suboxone per her daughter.  Normocytic anemia, iron def : Hemoglobin stable.  8.7 on 01/23/2020 --> 9.3 -Follow-up with  CBC --continue oral iron supplement  GERD (gastroesophageal reflux disease) -Protonix  Vaginal spotting Hx of benign endometrial polyp s/p IUD --CT a/p on presentation showed IUD displaced. --Contacted ObGyn today who said fixing IUD placement can be done as outpatient after acute infection/abscesses resolve.    DVT prophylaxis: SCD/Compression stockings Code Status: Full code  Family Communication:  Status is: inpatient Dispo:   The patient is from: home Anticipated d/c is to: Home with [facility] Anticipated d/c date is: tomorrow Patient currently is medically stable to d/c.   Subjective and Interval History:  Pt reported feeling better.  Sepsis resolving.  Pt reported vaginal spotting presumed due to mis-placed IUD.    Objective: Vitals:   02/14/20 0452 02/14/20 1205 02/14/20 1928 02/15/20 1736  BP: 111/64 (!) 104/51 121/62 (!) 116/53  Pulse: 71 70 75 70  Resp: 18 20 19 14   Temp: 97.9 F (36.6 C) 97.9 F (36.6 C) 97.7 F (36.5 C) 97.7 F (36.5 C)  TempSrc: Oral Oral Oral Oral  SpO2: 96%  100% 96%  Weight:      Height:        Intake/Output Summary (Last 24 hours) at 02/15/2020 1835 Last data filed at 02/15/2020 1500 Gross per 24 hour  Intake 777.48 ml  Output 20 ml  Net 757.48 ml   Filed Weights   02/11/20 2153  Weight: 99.8 kg    Examination:   Constitutional: NAD, AAOx3 HEENT: conjunctivae and lids normal, EOMI CV: RRR no M,R,G. Distal pulses +2.  No cyanosis.   RESP: CTA B/L, normal respiratory effort  GI: +BS, ND, both drain outputting purulent fluids Extremities: No effusions, edema, or tenderness in BLE SKIN: warm, dry and intact Neuro: II - XII grossly intact.  Sensation intact Psych: Normal mood and affect.      Data Reviewed: I have personally reviewed following labs and imaging studies  CBC: Recent Labs  Lab 02/11/20 2213 02/13/20 0525 02/14/20 0452 02/15/20 0554  WBC 20.4* 16.9* 10.3 8.8  NEUTROABS 16.7*  --   --   --   HGB 9.3*  8.0* 8.5* 8.1*  HCT 29.1* 24.3* 27.5* 25.1*  MCV 86.9 85.0 89.6 86.3  PLT 535* 476* 497* 533*   Basic Metabolic Panel: Recent Labs  Lab 02/11/20 2213 02/13/20 0525 02/14/20 0452 02/15/20 0554  NA 134* 135 138 139  K 3.7 3.3* 3.5 3.4*  CL 91* 93* 97* 100  CO2 29 29 30 31   GLUCOSE 208* 221* 249* 167*  BUN 16 9 8  7*  CREATININE 0.63 0.62 0.54 0.53  CALCIUM 9.6 9.3 9.2 8.8*  MG  --   --  1.4* 1.2*   GFR: Estimated Creatinine Clearance: 83.1 mL/min (by C-G formula based on SCr of 0.53 mg/dL). Liver Function Tests: Recent Labs  Lab 02/11/20 2213  AST 15  ALT 11  ALKPHOS 86  BILITOT 0.8  PROT 7.3  ALBUMIN 2.8*   No results for input(s): LIPASE, AMYLASE in the last 168 hours. No results for input(s): AMMONIA in the last 168 hours. Coagulation Profile: Recent Labs  Lab 02/12/20 0928  INR 1.3*   Cardiac Enzymes: No results for input(s): CKTOTAL, CKMB, CKMBINDEX, TROPONINI in the last 168 hours. BNP (last 3 results) No results for input(s): PROBNP in the last 8760 hours. HbA1C: No results for input(s): HGBA1C in the last 72 hours. CBG: Recent Labs  Lab 02/14/20 2307 02/15/20 0311 02/15/20 0734 02/15/20 1144 02/15/20 1626  GLUCAP 263* 184* 161* 223* 221*   Lipid Profile: No results for input(s): CHOL, HDL, LDLCALC, TRIG, CHOLHDL, LDLDIRECT in the last 72 hours. Thyroid Function Tests: No results for input(s): TSH, T4TOTAL, FREET4, T3FREE, THYROIDAB in the last 72 hours. Anemia Panel: No results for input(s): VITAMINB12, FOLATE, FERRITIN, TIBC, IRON, RETICCTPCT in the last 72 hours. Sepsis Labs: Recent Labs  Lab 02/11/20 2213  PROCALCITON 0.54  LATICACIDVEN 1.7    Recent Results (from the past 240 hour(s))  Urine Culture     Status: None   Collection Time: 02/11/20 10:13 PM   Specimen: Urine, Random  Result Value Ref Range Status   Specimen Description   Final    URINE, RANDOM Performed at [facility], [address] [place], [place]  [address]    Special Requests   Final    Normal Performed at [facility], [address] [place], [place] [address]    Culture   Final    NO GROWTH Performed at [facility], [address] [place], [place] [address]    Report Status 02/12/2020 FINAL  Final  Blood culture (routine x 2)     Status: None (Preliminary result)   Collection Time: 02/12/20  4:20 AM   Specimen: BLOOD  Result Value Ref Range Status   Specimen Description BLOOD RIGHT FA  Final   Special Requests   Final    BOTTLES DRAWN AEROBIC AND ANAEROBIC Blood Culture results may not be optimal due to an inadequate volume of blood received in culture bottles   Culture   Final    NO GROWTH 3 DAYS Performed at [facility], [address] [place], [place] [address]    Report Status PENDING  Incomplete  Blood culture (routine x 2)  Status: None (Preliminary result)   Collection Time: 02/12/20  4:20 AM   Specimen: BLOOD  Result Value Ref Range Status   Specimen Description BLOOD LEFT FA  Final   Special Requests   Final    BOTTLES DRAWN AEROBIC AND ANAEROBIC Blood Culture adequate volume   Culture   Final    NO GROWTH 3 DAYS Performed at [facility], [address] [place], [place] [address]    Report Status PENDING  Incomplete  SARS Coronavirus 2 by RT PCR (hospital order, performed in [facility] hospital lab) Nasopharyngeal Nasopharyngeal Swab     Status: None   Collection Time: 02/12/20  4:27 AM   Specimen: Nasopharyngeal Swab  Result Value Ref Range Status   SARS Coronavirus 2 NEGATIVE NEGATIVE Final    Comment: (NOTE) SARS-CoV-2 target nucleic acids are NOT DETECTED.  The SARS-CoV-2 RNA is generally detectable in upper and lower respiratory specimens during the acute phase of infection. The lowest concentration of SARS-CoV-2 viral copies this assay can detect is 250 copies / mL. A negative result does not preclude SARS-CoV-2 infection and should not be used as the  sole basis for treatment or other patient management decisions.  A negative result may occur with improper specimen collection / handling, submission of specimen other than nasopharyngeal swab, presence of viral mutation(s) within the areas targeted by this assay, and inadequate number of viral copies (<250 copies / mL). A negative result must be combined with clinical observations, patient history, and epidemiological information.  Fact Sheet for Patients:   BoilerBrush.com.cy  Fact Sheet for Healthcare Providers: https://pope.com/  This test is not yet approved or  cleared by the [place] FDA and has been authorized for detection and/or diagnosis of SARS-CoV-2 by FDA under an Emergency Use Authorization (EUA).  This EUA will remain in effect (meaning this test can be used) for the duration of the COVID-19 declaration under Section 564(b)(1) of the Act, 21 U.S.C. section 360bbb-3(b)(1), unless the authorization is terminated or revoked sooner.  Performed at [facility], [address] [place], [place] [address]   Aerobic/Anaerobic Culture (surgical/deep wound)     Status: None (Preliminary result)   Collection Time: 02/13/20 10:32 AM   Specimen: Abscess  Result Value Ref Range Status   Specimen Description   Final    ABSCESS RIGHT LOWER PERITONEAL Performed at [facility], [address] [place], [place] [address]    Special Requests   Final    ABSCESS Performed at [facility], [address] [place], [place] [address]    Gram Stain   Final    ABUNDANT WBC PRESENT, PREDOMINANTLY PMN NO ORGANISMS SEEN Performed at [facility], [address], [place], [place] [address]    Culture   Final    ABUNDANT KLEBSIELLA PNEUMONIAE NO ANAEROBES ISOLATED; CULTURE IN PROGRESS FOR 5 DAYS    Report Status PENDING  Incomplete   Organism ID, Bacteria KLEBSIELLA PNEUMONIAE  Final       Susceptibility   Klebsiella pneumoniae - MIC*    AMPICILLIN >=32 RESISTANT Resistant     CEFAZOLIN <=4 SENSITIVE Sensitive     CEFEPIME 0.25 SENSITIVE Sensitive     CEFTAZIDIME <=1 SENSITIVE Sensitive     CEFTRIAXONE 1 SENSITIVE Sensitive     CIPROFLOXACIN <=0.25 SENSITIVE Sensitive     GENTAMICIN <=1 SENSITIVE Sensitive     IMIPENEM <=0.25 SENSITIVE Sensitive     TRIMETH/SULFA <=20 SENSITIVE Sensitive  AMPICILLIN/SULBACTAM >=32 RESISTANT Resistant     PIP/TAZO 16 SENSITIVE Sensitive     * ABUNDANT KLEBSIELLA PNEUMONIAE  Aerobic/Anaerobic Culture (surgical/deep wound)     Status: None (Preliminary result)   Collection Time: 02/13/20 10:32 AM   Specimen: Abdomen; Abscess  Result Value Ref Range Status   Specimen Description   Final    ABDOMEN RIGHT UPPER PERITONEAL Performed at [facility], [address] [place], [place] [address]    Special Requests   Final    NONE Performed at [facility], [address] [place], [place] [address]    Gram Stain   Final    ABUNDANT WBC PRESENT, PREDOMINANTLY PMN NO ORGANISMS SEEN Performed at [facility], [address], [place], [place] [address]    Culture   Final    RARE KLEBSIELLA PNEUMONIAE NO ANAEROBES ISOLATED; CULTURE IN PROGRESS FOR 5 DAYS    Report Status PENDING  Incomplete   Organism ID, Bacteria KLEBSIELLA PNEUMONIAE  Final      Susceptibility   Klebsiella pneumoniae - MIC*    AMPICILLIN >=32 RESISTANT Resistant     CEFAZOLIN <=4 SENSITIVE Sensitive     CEFEPIME 0.25 SENSITIVE Sensitive     CEFTAZIDIME <=1 SENSITIVE Sensitive     CEFTRIAXONE 1 SENSITIVE Sensitive     CIPROFLOXACIN <=0.25 SENSITIVE Sensitive     GENTAMICIN <=1 SENSITIVE Sensitive     IMIPENEM 0.5 SENSITIVE Sensitive     TRIMETH/SULFA <=20 SENSITIVE Sensitive     AMPICILLIN/SULBACTAM 16 INTERMEDIATE Intermediate     PIP/TAZO 16 SENSITIVE Sensitive     * RARE KLEBSIELLA PNEUMONIAE      Radiology Studies: No results  found.   Scheduled Meds: . aspirin EC  81 mg Oral Daily  . atorvastatin  40 mg Oral Daily  . ciprofloxacin  500 mg Oral BID  . DULoxetine  60 mg Oral BID  . ferrous sulfate  325 mg Oral Q breakfast  . gabapentin  900 mg Oral TID  . insulin aspart  0-20 Units Subcutaneous TID WC  . insulin glargine  15 Units Subcutaneous BID  . multivitamin with minerals  1 tablet Oral Daily  . omega-3 acid ethyl esters  1,000 mg Oral Daily  . pantoprazole  40 mg Oral Daily  . sodium chloride flush  5 mL Intracatheter Q8H   Continuous Infusions:    LOS: 3 days     Darlin Priestly, MD [facility] If 7PM-7AM, please contact night-coverage 02/15/2020, 6:35 PM

## 2020-02-15 NOTE — Care Management Important Message (Signed)
Important Message  Patient Details  Name: April Burton MRN: [id number] Date of Birth: 1956-03-22   Medicare Important Message Given:  Yes     Johnell Comings 02/15/2020, 12:39 PM

## 2020-02-15 NOTE — Progress Notes (Signed)
Physical Therapy Treatment Patient Details Name: April Burton MRN: [id number] DOB: [birth year] Today's Date: 02/15/2020    History of Present Illness Pt  is a 64 y.o. female with medical history significant for HTN, DM, chronic pain on Suboxone, anemia, depression, and GERD. Per MD impression, pt currently presents with: sepsis due to peritoneal abcess s/p 8/6 JP drain insertion, UTI, acute metabolic encephalopathy, DM2, HTN, depression, buprenorphine dependence, and normocytic anemia.    PT Comments    Pt was pleasant and motivated to participate during the session. Pt found in sitting; SpO2 96% and HR 76bpm. Therapist left room to retrieve mask for pt, upon return pt was out of chair and walking to bathroom. Pt demonstrated general impulsivity such as this throughout session. Pt continued to demonstrate mod-I w/ sit-to-stand and no noted difficulty. Pt ambulated 480ft this session w/ SPC and supervision; significantly slowed gait speed but otherwise gait pattern was [facility] w/ no LOB. Following ambulaton, SpO2 98% and HR 84bpm. Pt returned to room, seated in recliner for a brief rest. Pt returned to standing for balance training; primarily demonstrated deficits w/ BLE SLS, limited to ~10sec and wobbly throughout. Pt appeared steady w/ tandem, rhomberg eyes closed, and squats in place. Pt will benefit from HHPT services upon discharge to safely address deficits listed in patient problem list for decreased caregiver assistance and eventual return to PLOF.    Follow Up Recommendations  Home health PT;Supervision for mobility/OOB     Equipment Recommendations  None recommended by PT    Recommendations for Other Services       Precautions / Restrictions Precautions Precautions: Fall Restrictions Weight Bearing Restrictions: No    Mobility  Bed Mobility               General bed mobility comments: deferred, up in recliner  Transfers Overall transfer level: Modified  independent              General transfer comment: STS without difficulty  Ambulation/Gait Ambulation/Gait assistance: Supervision Gait Distance (Feet): 450 Feet Assistive device: Straight cane Gait Pattern/deviations: WFL(Within Functional Limits) Gait velocity: decreased   General Gait Details: Pt continued to ambulate with slow, but otherwise functional, gait. Pt appeared to walk better w/ the [facility] this session (compared to RW last session). Generally steady throughout ambulation w/ no LOB   Stairs             Wheelchair Mobility    Modified Rankin (Stroke Patients Only)       Balance Overall balance assessment: Needs assistance   Sitting balance-Leahy Scale: Good       Standing balance-Leahy Scale: Good Standing balance comment: Overall good balance; primary limitations w/ SLS and no UE support, pt was able to maintain for 10sec BLE but wobbly throughout. Pt generally steady for other balance exercises Single Leg Stance - Right Leg: 10 Single Leg Stance - Left Leg: 10 Tandem Stance - Right Leg: 30 Tandem Stance - Left Leg: 30 Rhomberg - Eyes Opened: 30 Rhomberg - Eyes Closed: 30                Cognition Arousal/Alertness: Awake/alert Behavior During Therapy: Impulsive Overall Cognitive Status: No family/caregiver present to determine baseline cognitive functioning                                 General Comments: Follows commands well, some general confusion noted throughout session. Very impulsive and will stand  up / start doing own thing      Exercises Other Exercises Other Exercises: x10 mini squats; steady w/ no LOB    General Comments        Pertinent Vitals/Pain Pain Assessment: Faces Faces Pain Scale: Hurts a little bit Pain Location: abdomen Pain Descriptors / Indicators: Aching Pain Intervention(s): Limited activity within patient's tolerance;Monitored during session;Repositioned    Home Living                       Prior Function            PT Goals (current goals can now be found in the care plan section) Progress towards PT goals: Progressing toward goals    Frequency    Min 2X/week      PT Plan Current plan remains appropriate    Co-evaluation              AM-PAC PT "6 Clicks" Mobility   Outcome Measure  Help needed turning from your back to your side while in a flat bed without using bedrails?: A Little Help needed moving from lying on your back to sitting on the side of a flat bed without using bedrails?: A Little Help needed moving to and from a bed to a chair (including a wheelchair)?: None Help needed standing up from a chair using your arms (e.g., wheelchair or bedside chair)?: None Help needed to walk in hospital room?: None Help needed climbing 3-5 steps with a railing? : A Little 6 Click Score: 21    End of Session Equipment Utilized During Treatment: Gait belt Activity Tolerance: Patient tolerated treatment well Patient left: in chair;with call bell/phone within reach;Other (comment) (no chair alarm) Nurse Communication: Mobility status PT Visit Diagnosis: Muscle weakness (generalized) (M62.81);Unsteadiness on feet (R26.81);Difficulty in walking, not elsewhere classified (R26.2)     Time: 6283-1517 PT Time Calculation (min) (ACUTE ONLY): 40 min  Charges:                       Vadie Principato SPT 02/15/20, 3:24 PM

## 2020-02-16 LAB — CBC
HCT: 27 % — ABNORMAL LOW (ref 36.0–46.0)
Hemoglobin: 8.6 g/dL — ABNORMAL LOW (ref 12.0–15.0)
MCH: 27.7 pg (ref 26.0–34.0)
MCHC: 31.9 g/dL (ref 30.0–36.0)
MCV: 86.8 fL (ref 80.0–100.0)
Platelets: 569 10*3/uL — ABNORMAL HIGH (ref 150–400)
RBC: 3.11 MIL/uL — ABNORMAL LOW (ref 3.87–5.11)
RDW: 14.5 % (ref 11.5–15.5)
WBC: 10 10*3/uL (ref 4.0–10.5)
nRBC: 0 % (ref 0.0–0.2)

## 2020-02-16 LAB — BASIC METABOLIC PANEL WITH GFR
Anion gap: 11 (ref 5–15)
BUN: 7 mg/dL — ABNORMAL LOW (ref 8–23)
CO2: 30 mmol/L (ref 22–32)
Calcium: 9.2 mg/dL (ref 8.9–10.3)
Chloride: 97 mmol/L — ABNORMAL LOW (ref 98–111)
Creatinine, Ser: 0.6 mg/dL (ref 0.44–1.00)
GFR calc Af Amer: >60 mL/min
GFR calc non Af Amer: >60 mL/min
Glucose, Bld: 262 mg/dL — ABNORMAL HIGH (ref 70–99)
Potassium: 4.7 mmol/L (ref 3.5–5.1)
Sodium: 138 mmol/L (ref 135–145)

## 2020-02-16 LAB — GLUCOSE, CAPILLARY
Glucose-Capillary: 206 mg/dL — ABNORMAL HIGH (ref 70–99)
Glucose-Capillary: 219 mg/dL — ABNORMAL HIGH (ref 70–99)
Glucose-Capillary: 231 mg/dL — ABNORMAL HIGH (ref 70–99)
Glucose-Capillary: 251 mg/dL — ABNORMAL HIGH (ref 70–99)

## 2020-02-16 LAB — MAGNESIUM: Magnesium: 1.7 mg/dL (ref 1.7–2.4)

## 2020-02-16 MED ORDER — SPIRONOLACTONE 25 MG PO TABS: ORAL_TABLET | ORAL | Status: AC

## 2020-02-16 MED ORDER — CIPROFLOXACIN HCL 500 MG PO TABS: 500.0000 mg | ORAL_TABLET | Freq: Two times a day (BID) | ORAL | 0 refills | Status: AC

## 2020-02-16 MED ORDER — FERROUS SULFATE 325 (65 FE) MG PO TABS: 325.0000 mg | ORAL_TABLET | Freq: Every day | ORAL | 3 refills | Status: AC

## 2020-02-16 MED ORDER — INSULIN GLARGINE 100 UNIT/ML ~~LOC~~ SOLN: 20.0000 [IU] | Freq: Two times a day (BID) | SUBCUTANEOUS | Status: AC

## 2020-02-16 MED ORDER — SEMAGLUTIDE (1 MG/DOSE) 2 MG/1.5ML ~~LOC~~ SOPN: 1.0000 mg | PEN_INJECTOR | SUBCUTANEOUS | 0 refills | Status: AC

## 2020-02-16 MED ORDER — MORPHINE SULFATE 15 MG PO TABS
15.0000 mg | ORAL_TABLET | Freq: Three times a day (TID) | ORAL | 0 refills | Status: AC | PRN
Start: 2020-02-16 — End: 2020-02-21

## 2020-02-16 MED ORDER — ENALAPRIL MALEATE 10 MG PO TABS: ORAL_TABLET | ORAL | 0 refills | Status: AC

## 2020-02-16 NOTE — Progress Notes (Signed)
Patient given education on emptying drain prior to d/c.

## 2020-02-16 NOTE — TOC Transition Note (Signed)
Transition of Care [facility]) - CM/SW Discharge Note   Patient Details  Name: April Burton MRN: [id number] Date of Birth: 1955/07/11  Transition of [facility]) CM/SW Contact:  Luvenia Redden, RN Phone Number: 02/16/2020, 1:35 PM   Clinical Narrative:     RN spoke with pt today and discuss discharge planning information related to involve agency [facility] Camila Li) for [facility] PT/RN/OT.  Provided updated in the above arrangements and pt is aware with no acute delays or issues at this time. Note [facility] aware pt will be discharged today however pt wished to contact her family with her discharge plans. Pt needed a taxi voucher for transport services to her plan of residence. Voucher provided via bedside nurse for discharge today.  TOC team remains available for any ongoing needs.   Final next level of care: Home w Home Health Services ([facility] Elnita Maxwell)) Barriers to Discharge: No Barriers Identified   Patient Goals and CMS Choice Patient states their goals for this hospitalization and ongoing recovery are:: To get better   Choice offered to / list presented to : Patient  Discharge Placement                       Discharge Plan and Services                DME Arranged: Walker rolling DME Agency: Other - Comment Loyal Buba) Date DME Agency Contacted: 02/16/20 Time DME Agency Contacted: 1200 Representative spoke with at DME Agency: Shaune Leeks HH Arranged: RN, PT, OT [facility] Agency: [facility] Date [facility] Agency Contacted: 02/16/20 Time HH Agency Contacted: 1230 Representative spoke with at [facility] Agency: Cherly  Social Determinants of Health (SDOH) Interventions     Readmission Risk Interventions No flowsheet data found.

## 2020-02-16 NOTE — Discharge Summary (Addendum)
Physician Discharge Summary   April Burton  female DOB: 07-03-1955  NOI:[id number]  PCP: [facility]  Admit date: 02/12/2020 Discharge date: 02/16/2020  Admitted From: home Disposition:  home Home Health: Yes CODE STATUS: Full code  Discharge Instructions    Discharge instructions   Complete by: As directed    You have new drains for your intra-abdominal abscesses.  Please continue drain care as you have done in the past.  You have received 5 days of IV antibiotics.  Please finish 5 more days with oral Cipro as directed.  Please be sure to follow up with your surgeons in [facility].  I have held your blood pressure medication Enalapril and Spironolactone because your blood pressure was low normal during your hospitalization without any blood pressure medications.  Please follow up with your outpatient doctor to see if/when you should resume them.  Please follow up with ObGyn in [facility] for your displaced IUD.   Dr. Darlin Priestly - -   Discharge wound care:   Complete by: As directed    Continue drain care and flushes as you have done in the past.       Hospital Course:  For full details, please see H&P, progress notes, consult notes and ancillary notes.  Briefly,   April Burton is a 64 y.o. Caucasian female with hx of DM2, HTN, choledocholithiasis s/p cholecystectomy on 09/06/19 who presented with her JP drain not working.  She had cholecystectomy on 09/06/19 at [facility]. Pt developed peritoneal abscess and a JP drain placed on 8/6. Per her daughter, the drain was not working.   [facility] was contacted and there was no bed available.  Patient's surgeon thought that this can be drained by IR here.  Sepsis due to peritoneal abscess [facility]):  Pt presented with leukocytosis WBC 20.4, tachycardia. Lactic acid is normal. Procalcitonin 0.54.  CT a/p showed "Postsurgical changes from cholecystectomy with heterogenous multiloculated collections extending along the hepatic  capsule, right posterior abdominal wall as well as a separate collection along the iliacus musculature with associated myositis. Appearance is concerning for postsurgical abscesses."  Pt was started on zosyn on presentation.  IR placed 2 drains on 8/18 which output purulent fluids.  Abscess cx grew pan-sensitive Klebsiella.  Pt received 5 days of IV abx and then transitioned to oral Cirpo to finish 5 more days at home.  Pt was advised to follow up with the surgeons in [facility] for this post-surgical complication.  I had also requested TOC to help ensure pt has a followup appointment with [facility].   UTI, ruled out Patient was recently admitted and treated for UTI/pyelonephritis. Patient still had burning on urination. Still had positive urine analysis, however, urine cx had no growth.  Acute metabolic encephalopathy, resolved.  Patient was alert, oriented x3. No focal neuro deficit on physical examination.  Held off Suboxone per her daughter, and avoided excessive narcotics and sedatives.    Type 2 diabetes mellitus poorly controlled with hyperglycemia Recent A1c 8.6. Patient is taking semaglutide, Metformin, Humalog, Lantus at home.  Pt was prescribed Lantus 15u BID and Sliding scale insulin during hospitalization.  Pt was discharged on home regimen as below.  HTN (hypertension) home atenolol, enalapril and spironolactone were held due to intermittent soft BP.  Atenolol resumed at discharge, however, enalapril and spironolactone continued to be held pending outpatient followup.  Depression Continued home medications  Buprenorphine dependence (HCC):  Per her daughter, Suboxone made patient too drowsy and was stopped a few days  prior by her daughter.  Normocytic anemia, iron def Hemoglobin stable in 8's. Continued oral iron supplement  GERD (gastroesophageal reflux disease) Continued Protonix  # Vaginal spotting # Hx of bilateral benign ovarian serous cystadenofibromas # Hx of  benign endometrial polyp s/p IUD CT a/p on presentation showed IUD displaced.  Contacted inpatient ObGyn who said fixing IUD placement can be done as outpatient after acute infection/abscesses resolve.  Pt was therefore referred back to the [facility] who placed the Mirena IUD.   Discharge Diagnoses:  Principal Problem:   Peritoneal abscess (HCC) Active Problems:   UTI (urinary tract infection)   Acute metabolic encephalopathy   Type 2 diabetes mellitus without complication (HCC)   HTN (hypertension)   Depression   Buprenorphine dependence (HCC)   Normocytic anemia   GERD (gastroesophageal reflux disease)   Sepsis (HCC)    Discharge Instructions:  Allergies as of 02/16/2020      Reactions   Acetaminophen    Advised not to take due to fatty liver, but pt admits she will take acetaminophen on occasion.   Empagliflozin Other (See Comments)   Yeast infx   Adhesive [tape] Rash   Diclofenac Sodium Other (See Comments)   Caused GERD to become worse.   Neomycin-bacitracin-polymyxin [bacitracin-neomycin-polymyxin] Rash   Pregabalin Nausea And Vomiting      Medication List    STOP taking these medications   buprenorphine-naloxone 2-0.5 mg Subl SL tablet Commonly known as: SUBOXONE   clonazePAM 1 MG disintegrating tablet Commonly known as: KLONOPIN   famotidine 40 MG tablet Commonly known as: PEPCID   ibuprofen 600 MG tablet Commonly known as: ADVIL     TAKE these medications   aspirin EC 81 MG tablet Take 81 mg by mouth daily.   atenolol 25 MG tablet Commonly known as: TENORMIN Take 12.5 mg by mouth 2 (two) times daily.   atorvastatin 80 MG tablet Commonly known as: LIPITOR Take 40 mg by mouth in the morning and at bedtime.   ciprofloxacin 500 MG tablet Commonly known as: CIPRO Take 1 tablet (500 mg total) by mouth 2 (two) times daily for 5 days. Antibiotic.   docusate sodium 100 MG capsule Commonly known as: COLACE Take 200 mg by mouth daily.   DULoxetine  60 MG capsule Commonly known as: CYMBALTA Take 60 mg by mouth 2 (two) times daily.   enalapril 10 MG tablet Commonly known as: VASOTEC Hold until followup with outpatient doctor since your blood pressure was low normal during your hospitalization without any blood pressure medication. What changed:   how much to take  how to take this  when to take this  additional instructions   EpiPen 2-Pak 0.3 mg/0.3 mL Soaj injection Generic drug: EPINEPHrine Inject 0.3 mg into the muscle as needed for anaphylaxis.   esomeprazole 40 MG capsule Commonly known as: NEXIUM Take 40 mg by mouth daily.   ferrous sulfate 325 (65 FE) MG tablet Take 1 tablet (325 mg total) by mouth daily with breakfast. Can take any over-the-counter iron supplement.   gabapentin 300 MG capsule Commonly known as: NEURONTIN Take 900 mg by mouth 3 (three) times daily.   insulin glargine 100 UNIT/ML injection Commonly known as: Lantus Inject 0.2 mLs (20 Units total) into the skin 2 (two) times daily.   insulin lispro 100 UNIT/ML injection Commonly known as: HUMALOG Inject 0.2 mLs (20 Units total) into the skin 3 (three) times daily before meals. What changed:   how much to take  additional instructions  metFORMIN 500 MG 24 hr tablet Commonly known as: GLUCOPHAGE-XR Take 1,000 mg by mouth 2 (two) times daily.   morphine 15 MG tablet Commonly known as: MSIR Take 1 tablet (15 mg total) by mouth every 8 (eight) hours as needed for up to 5 days for moderate pain or severe pain.   multivitamin tablet Take 1 tablet by mouth daily.   nitroGLYCERIN 0.4 MG SL tablet Commonly known as: NITROSTAT Place 0.4 mg under the tongue every 5 (five) minutes x 3 doses as needed for chest pain.   Omega 3 1000 MG Caps Take 1,000 mg by mouth daily.   promethazine 25 MG tablet Commonly known as: PHENERGAN Take 25 mg by mouth every 8 (eight) hours as needed for nausea or vomiting.   Semaglutide (1 MG/DOSE) 2 MG/1.5ML  Sopn Inject 0.75 mLs (1 mg total) into the skin every 7 (seven) days.   spironolactone 25 MG tablet Commonly known as: ALDACTONE Hold until followup with outpatient doctor since your blood pressure was low normal during your hospitalization without any blood pressure medication. What changed:   how much to take  how to take this  when to take this  additional instructions            Discharge Care Instructions  (From admission, onward)         Start     Ordered   02/16/20 0000  Discharge wound care:       Comments: Continue drain care and flushes as you have done in the past.   02/16/20 0848           Follow-up Information    [place]. Schedule an appointment as soon as possible for a visit in 1 week(s).   Specialty: Family Medicine Contact information: [address] [place] [address] [phone]        Burnett Corrente Schedule an appointment as soon as possible for a visit in 1 week(s).   Specialty: Surgery Why: for your persistent intra-abdominal abscesses Contact information: [address] [facility] [address] [place] [address] [phone]        Rita Ohara, MD. Schedule an appointment as soon as possible for a visit in 2 week(s).   Specialty: Obstetrics and Gynecology Why: for your displaced IUD Contact information: [address] [place] [address] [phone]               Allergies  Allergen Reactions  . Acetaminophen     Advised not to take due to fatty liver, but pt admits she will take acetaminophen on occasion.  . Empagliflozin Other (See Comments)    Yeast infx  . Adhesive [Tape] Rash  . Diclofenac Sodium Other (See Comments)    Caused GERD to become worse.  Marland Kitchen Neomycin-Bacitracin-Polymyxin [Bacitracin-Neomycin-Polymyxin] Rash  . Pregabalin Nausea And Vomiting     The results of significant diagnostics from this hospitalization  (including imaging, microbiology, ancillary and laboratory) are listed below for reference.   Consultations:   Procedures/Studies: CT Head Wo Contrast  Result Date: 01/18/2020 CLINICAL DATA:  Altered mental status. EXAM: CT HEAD WITHOUT CONTRAST TECHNIQUE: Contiguous axial images were obtained from the base of the skull through the vertex without intravenous contrast. COMPARISON:  None. FINDINGS: Brain: No evidence of acute infarction, hemorrhage, hydrocephalus, extra-axial collection or mass lesion/mass effect. Vascular: No hyperdense vessel or unexpected calcification. Skull: Normal. Negative for fracture or focal lesion. Sinuses/Orbits: No acute finding. Other: None.  IMPRESSION: No acute intracranial pathology. Electronically Signed   By: Aram Candela M.D.   On: 01/18/2020 22:29   CT ABDOMEN PELVIS W CONTRAST  Result Date: 02/12/2020 CLINICAL DATA:  Nonlocalized abdominal pain, recent cholecystectomy. JP drain in place without drainage, concern for dislodgement. EXAM: CT ABDOMEN AND PELVIS WITH CONTRAST TECHNIQUE: Multidetector CT imaging of the abdomen and pelvis was performed using the standard protocol following bolus administration of intravenous contrast. CONTRAST:  OMNIPAQUE IOHEXOL 300 MG/ML  SOLN COMPARISON:  None. FINDINGS: Lower chest: Atelectatic changes in the otherwise clear lung bases. Normal heart size. No pericardial effusion. Coronary artery calcifications are present. Hepatobiliary: Patient is post cholecystectomy. A hypoattenuating multilobular collection with punctate radiodensities is seen extending along the posterolateral liver and extending across the liver dome (6/66, 3/23. Some associated hyperemia in the adjacent liver parenchyma is noted. Collection measures up to 7.1 by 3.7 by 9.5 cm in maximal dimensions though difficult to obtain an accurate measurement given the crescentic nature and multi loculated appearance. Portion of the collection extends inferiorly  from the liver tip and involves the posterior abdominal wall separately as well (3/43). A pigtail surgical drain is seen positioned along the right ninth interspace along the superficial margin of largest portion of this collection within a region of thickening of the intercostal musculature. No other focal concerning liver lesion. Slight prominence of the biliary tree is nonspecific in the post cholecystectomy state. Pancreas: Unremarkable. No pancreatic ductal dilatation or surrounding inflammatory changes. Spleen: Normal in size. No concerning splenic lesions. Adrenals/Urinary Tract: Normal adrenal glands. Bilateral perinephric stranding is noted. Slightly delayed appearance of the left renal nephrogram and bilaterally delayed excretion, nonspecific. No obstructing calculus, hydronephrosis or urolithiasis. Urinary bladder is largely decompressed at the time of exam and therefore poorly evaluated by CT imaging. Some wall thickening is present possibly related to underdistention. Stomach/Bowel: Distal esophagus, stomach and duodenal sweep are unremarkable. No small bowel wall thickening or dilatation. Fecalization of the distal small bowel contents without evidence of mechanical obstruction. Appendix is not visualized. No focal inflammation the vicinity of the cecum to suggest an occult appendicitis. No colonic dilatation or wall thickening. Vascular/Lymphatic: Atherosclerotic calcifications within the abdominal aorta and branch vessels. No aneurysm or ectasia. No enlarged abdominopelvic lymph nodes. Reproductive: Anteverted uterus. The patient's radiopaque IUD appears rotated within the endometrial canal with the limbs extended towards the lower uterine segment. Simple appearing 2.4 cm cyst present in the left adnexa. Other: Heterogenous multiloculated collections extending along the hepatic capsule right posterior abdominal wall and the iliacus musculature. Musculoskeletal: Febrile loculated collection is seen  involving the superior aspect of the iliacus musculature (3/54) measuring approximately 5.5 x 3.6 by up to 5 cm in craniocaudal extension. Some associated asymmetric thickening of the iliacus may reflect some associated myositis. No associated osseous erosion or destructive changes to suggest associated osteomyelitis at this time within the limitations of CT imaging. Multilevel degenerative changes are present in the imaged portions of the spine. Multilevel flowing anterior osteophytosis, compatible with features of diffuse idiopathic skeletal hyperostosis (DISH). IMPRESSION: 1. Postsurgical changes from cholecystectomy with heterogenous multiloculated collections extending along the hepatic capsule, right posterior abdominal wall as well as a separate collection along the iliacus musculature with associated myositis. Appearance is concerning for postsurgical abscesses. A pigtail pleural drain is positioned at the level of the ninth interspace likely superficial to the predominant multiloculated collection along hepatic surface. 2. Malpositioned radiopaque IUD with the limbs extended towards the lower uterine segment. 3. 2.3 cm  cyst in the left adnexa. Normal ovarian tissue is not well visualized. Could reflect a lymphocele in the setting of salpingooophorectomy, consider nonemergent ultrasound. 4. Fecalization of the distal small bowel contents without evidence of mechanical obstruction, may represent slowed intestinal transit. 5. Asymmetrically delayed left nephrogram with some bilateral perinephric stranding, nonspecific, but can be seen with ascending urinary tract infection. Correlate with urinalysis. 6. Aortic Atherosclerosis (ICD10-I70.0). These results were called by telephone at the time of interpretation on 02/12/2020 at 4:14 am to provider [facility] , who verbally acknowledged these results. Electronically Signed   By: Kreg Shropshire M.D.   On: 02/12/2020 04:14   DG Chest Port 1 View  Result Date:  01/18/2020 CLINICAL DATA:  Fever EXAM: PORTABLE CHEST 1 VIEW COMPARISON:  09/02/2017 FINDINGS: The heart size and mediastinal contours are within normal limits. Both lungs are clear. The visualized skeletal structures are unremarkable. IMPRESSION: No active disease. Electronically Signed   By: Jasmine Pang M.D.   On: 01/18/2020 22:24   CT IMAGE GUIDED DRAINAGE BY PERCUTANEOUS CATHETER  Result Date: 02/13/2020 INDICATION: 64 year old female with a history of multiple fluid collections suspicious for abscess EXAM: CT GUIDED DRAINAGE OF  ABSCESS MEDICATIONS: The patient is currently admitted to the hospital and receiving intravenous antibiotics. The antibiotics were administered within an appropriate time frame prior to the initiation of the procedure. ANESTHESIA/SEDATION: 2.0 mg IV Versed 100 mcg IV Fentanyl Moderate Sedation Time:  14 minutes The patient was continuously monitored during the procedure by the interventional radiology nurse under my direct supervision. COMPLICATIONS: None TECHNIQUE: Informed written consent was obtained from the patient after a thorough discussion of the procedural risks, benefits and alternatives. All questions were addressed. Maximal Sterile Barrier Technique was utilized including caps, mask, sterile gowns, sterile gloves, sterile drape, hand hygiene and skin antiseptic. A timeout was performed prior to the initiation of the procedure. PROCEDURE: The right upper abdomen and the right lower abdomen were prepped with chlorhexidine in a sterile fashion, and a sterile drape was applied covering the operative field. A sterile gown and sterile gloves were used for the procedure. Local anesthesia was provided with 1% Lidocaine. Using CT guidance, trocar needle was advanced into the perihepatic fluid collection. Once we confirmed needle tip position, modified Seldinger technique was used to place a 10 [place] drain. Sample of purulent fluid was sent for culture. We then turned our  attention to the lower collection. CT guidance was used to place a trocar needle into the fluid collection. Once we confirmed needle tip position, modified Seldinger technique was used to place a 10 [place] drain. Sample appearance fluid was sent for culture. Both drains were then sutured in position and attached to bulb suction. Final CT was acquired. Patient tolerated the procedure well and remained hemodynamically stable throughout. No complications were encountered and no significant blood loss. FINDINGS: CT demonstrates fluid collection within the right upper quadrant/perihepatic, with the final image demonstrating well-formed drainage catheter within the collection. CT also demonstrates a lower fluid collection intimately associated with the abdominal wall musculature. Final CT demonstrates pigtail catheter formed well within this collection. IMPRESSION: Status post CT-guided drainage of perihepatic fluid collection and right lower quadrant fluid collection, both with 10 French drainage to bulb suction. Signed, Yvone Neu. Reyne Dumas, RPVI [facility] Electronically Signed   By: Gilmer Mor D.O.   On: 02/13/2020 12:02   CT IMAGE GUIDED DRAINAGE BY PERCUTANEOUS CATHETER  Result Date: 02/13/2020 INDICATION: 64 year old female with  a history of multiple fluid collections suspicious for abscess EXAM: CT GUIDED DRAINAGE OF  ABSCESS MEDICATIONS: The patient is currently admitted to the hospital and receiving intravenous antibiotics. The antibiotics were administered within an appropriate time frame prior to the initiation of the procedure. ANESTHESIA/SEDATION: 2.0 mg IV Versed 100 mcg IV Fentanyl Moderate Sedation Time:  14 minutes The patient was continuously monitored during the procedure by the interventional radiology nurse under my direct supervision. COMPLICATIONS: None TECHNIQUE: Informed written consent was obtained from the patient after a  thorough discussion of the procedural risks, benefits and alternatives. All questions were addressed. Maximal Sterile Barrier Technique was utilized including caps, mask, sterile gowns, sterile gloves, sterile drape, hand hygiene and skin antiseptic. A timeout was performed prior to the initiation of the procedure. PROCEDURE: The right upper abdomen and the right lower abdomen were prepped with chlorhexidine in a sterile fashion, and a sterile drape was applied covering the operative field. A sterile gown and sterile gloves were used for the procedure. Local anesthesia was provided with 1% Lidocaine. Using CT guidance, trocar needle was advanced into the perihepatic fluid collection. Once we confirmed needle tip position, modified Seldinger technique was used to place a 10 [place] drain. Sample of purulent fluid was sent for culture. We then turned our attention to the lower collection. CT guidance was used to place a trocar needle into the fluid collection. Once we confirmed needle tip position, modified Seldinger technique was used to place a 10 [place] drain. Sample appearance fluid was sent for culture. Both drains were then sutured in position and attached to bulb suction. Final CT was acquired. Patient tolerated the procedure well and remained hemodynamically stable throughout. No complications were encountered and no significant blood loss. FINDINGS: CT demonstrates fluid collection within the right upper quadrant/perihepatic, with the final image demonstrating well-formed drainage catheter within the collection. CT also demonstrates a lower fluid collection intimately associated with the abdominal wall musculature. Final CT demonstrates pigtail catheter formed well within this collection. IMPRESSION: Status post CT-guided drainage of perihepatic fluid collection and right lower quadrant fluid collection, both with 10 French drainage to bulb suction. Signed, Yvone Neu. Reyne Dumas, RPVI [facility] Electronically Signed   By: Gilmer Mor D.O.   On: 02/13/2020 12:02      Labs: BNP (last 3 results) Recent Labs    02/11/20 2213  BNP 204.6*   Basic Metabolic Panel: Recent Labs  Lab 02/11/20 2213 02/13/20 0525 02/14/20 0452 02/15/20 0554 02/16/20 0605  NA 134* 135 138 139 138  K 3.7 3.3* 3.5 3.4* 4.7  CL 91* 93* 97* 100 97*  CO2 29 29 30 31 30   GLUCOSE 208* 221* 249* 167* 262*  BUN 16 9 8  7* 7*  CREATININE 0.63 0.62 0.54 0.53 0.60  CALCIUM 9.6 9.3 9.2 8.8* 9.2  MG  --   --  1.4* 1.2* 1.7   Liver Function Tests: Recent Labs  Lab 02/11/20 2213  AST 15  ALT 11  ALKPHOS 86  BILITOT 0.8  PROT 7.3  ALBUMIN 2.8*   No results for input(s): LIPASE, AMYLASE in the last 168 hours. No results for input(s): AMMONIA in the last 168 hours. CBC: Recent Labs  Lab 02/11/20 2213 02/13/20 0525 02/14/20 0452 02/15/20 0554 02/16/20 0605  WBC 20.4* 16.9* 10.3 8.8 10.0  NEUTROABS 16.7*  --   --   --   --   HGB 9.3* 8.0* 8.5* 8.1* 8.6*  HCT 29.1*  24.3* 27.5* 25.1* 27.0*  MCV 86.9 85.0 89.6 86.3 86.8  PLT 535* 476* 497* 533* 569*   Cardiac Enzymes: No results for input(s): CKTOTAL, CKMB, CKMBINDEX, TROPONINI in the last 168 hours. BNP: Invalid input(s): POCBNP CBG: Recent Labs  Lab 02/15/20 1626 02/15/20 2056 02/15/20 2356 02/16/20 0530 02/16/20 0812  GLUCAP 221* 214* 231* 219* 206*   D-Dimer No results for input(s): DDIMER in the last 72 hours. Hgb A1c No results for input(s): HGBA1C in the last 72 hours. Lipid Profile No results for input(s): CHOL, HDL, LDLCALC, TRIG, CHOLHDL, LDLDIRECT in the last 72 hours. Thyroid function studies No results for input(s): TSH, T4TOTAL, T3FREE, THYROIDAB in the last 72 hours.  Invalid input(s): FREET3 Anemia work up No results for input(s): VITAMINB12, FOLATE, FERRITIN, TIBC, IRON, RETICCTPCT in the last 72 hours. Urinalysis    Component Value Date/Time   COLORURINE AMBER  (A) 02/11/2020 2213   APPEARANCEUR CLOUDY (A) 02/11/2020 2213   LABSPEC 1.030 02/11/2020 2213   PHURINE 5.0 02/11/2020 2213   GLUCOSEU 50 (A) 02/11/2020 2213   HGBUR SMALL (A) 02/11/2020 2213   BILIRUBINUR NEGATIVE 02/11/2020 2213   KETONESUR 5 (A) 02/11/2020 2213   PROTEINUR 30 (A) 02/11/2020 2213   NITRITE NEGATIVE 02/11/2020 2213   LEUKOCYTESUR SMALL (A) 02/11/2020 2213   Sepsis Labs Invalid input(s): PROCALCITONIN,  WBC,  LACTICIDVEN Microbiology Recent Results (from the past 240 hour(s))  Urine Culture     Status: None   Collection Time: 02/11/20 10:13 PM   Specimen: Urine, Random  Result Value Ref Range Status   Specimen Description   Final    URINE, RANDOM Performed at [facility] [facility], [address] [place], [place] [address]    Special Requests   Final    Normal Performed at [facility], [address] [place], [place] [address]    Culture   Final    NO GROWTH Performed at [facility], [address] [place], [place] [address]    Report Status 02/12/2020 FINAL  Final  Blood culture (routine x 2)     Status: None (Preliminary result)   Collection Time: 02/12/20  4:20 AM   Specimen: BLOOD  Result Value Ref Range Status   Specimen Description BLOOD RIGHT FA  Final   Special Requests   Final    BOTTLES DRAWN AEROBIC AND ANAEROBIC Blood Culture results may not be optimal due to an inadequate volume of blood received in culture bottles   Culture   Final    NO GROWTH 4 DAYS Performed at [facility], [address] [place], [place] [address]    Report Status PENDING  Incomplete  Blood culture (routine x 2)     Status: None (Preliminary result)   Collection Time: 02/12/20  4:20 AM   Specimen: BLOOD  Result Value Ref Range Status   Specimen Description BLOOD LEFT FA  Final   Special Requests   Final    BOTTLES DRAWN AEROBIC AND ANAEROBIC Blood Culture adequate volume   Culture   Final    NO GROWTH 4 DAYS Performed at  [facility], [address] [place], [place] [address]    Report Status PENDING  Incomplete  SARS Coronavirus 2 by RT PCR (hospital order, performed in [facility]) Nasopharyngeal Nasopharyngeal Swab     Status: None   Collection Time: 02/12/20  4:27 AM   Specimen: Nasopharyngeal Swab  Result Value Ref Range Status   SARS Coronavirus 2 NEGATIVE  NEGATIVE Final    Comment: (NOTE) SARS-CoV-2 target nucleic acids are NOT DETECTED.  The SARS-CoV-2 RNA is generally detectable in upper and lower respiratory specimens during the acute phase of infection. The lowest concentration of SARS-CoV-2 viral copies this assay can detect is 250 copies / mL. A negative result does not preclude SARS-CoV-2 infection and should not be used as the sole basis for treatment or other patient management decisions.  A negative result may occur with improper specimen collection / handling, submission of specimen other than nasopharyngeal swab, presence of viral mutation(s) within the areas targeted by this assay, and inadequate number of viral copies (<250 copies / mL). A negative result must be combined with clinical observations, patient history, and epidemiological information.  Fact Sheet for Patients:   BoilerBrush.com.cy  Fact Sheet for Healthcare Providers: https://pope.com/  This test is not yet approved or  cleared by the [place] FDA and has been authorized for detection and/or diagnosis of SARS-CoV-2 by FDA under an Emergency Use Authorization (EUA).  This EUA will remain in effect (meaning this test can be used) for the duration of the COVID-19 declaration under Section 564(b)(1) of the Act, 21 U.S.C. section 360bbb-3(b)(1), unless the authorization is terminated or revoked sooner.  Performed at [facility], [address] [place], [place] [address]   Aerobic/Anaerobic Culture (surgical/deep wound)      Status: None (Preliminary result)   Collection Time: 02/13/20 10:32 AM   Specimen: Abscess  Result Value Ref Range Status   Specimen Description   Final    ABSCESS RIGHT LOWER PERITONEAL Performed at [facility], [address] [place], [place] [address]    Special Requests   Final    ABSCESS Performed at [facility], [address] [place], [place] [address]    Gram Stain   Final    ABUNDANT WBC PRESENT, PREDOMINANTLY PMN NO ORGANISMS SEEN Performed at [facility], [address] [place], [place] [address]    Culture   Final    ABUNDANT KLEBSIELLA PNEUMONIAE NO ANAEROBES ISOLATED; CULTURE IN PROGRESS FOR 5 DAYS    Report Status PENDING  Incomplete   Organism ID, Bacteria KLEBSIELLA PNEUMONIAE  Final      Susceptibility   Klebsiella pneumoniae - MIC*    AMPICILLIN >=32 RESISTANT Resistant     CEFAZOLIN <=4 SENSITIVE Sensitive     CEFEPIME 0.25 SENSITIVE Sensitive     CEFTAZIDIME <=1 SENSITIVE Sensitive     CEFTRIAXONE 1 SENSITIVE Sensitive     CIPROFLOXACIN <=0.25 SENSITIVE Sensitive     GENTAMICIN <=1 SENSITIVE Sensitive     IMIPENEM <=0.25 SENSITIVE Sensitive     TRIMETH/SULFA <=20 SENSITIVE Sensitive     AMPICILLIN/SULBACTAM >=32 RESISTANT Resistant     PIP/TAZO 16 SENSITIVE Sensitive     * ABUNDANT KLEBSIELLA PNEUMONIAE  Aerobic/Anaerobic Culture (surgical/deep wound)     Status: None (Preliminary result)   Collection Time: 02/13/20 10:32 AM   Specimen: Abdomen; Abscess  Result Value Ref Range Status   Specimen Description   Final    ABDOMEN RIGHT UPPER PERITONEAL Performed at [facility], [address] [place], [place] [address]    Special Requests   Final    NONE Performed at [facility], [address] [place], [place] [address]    Gram Stain   Final    ABUNDANT WBC PRESENT, PREDOMINANTLY PMN NO ORGANISMS SEEN Performed at [facility], [address] [place], [place] [address]  Culture   Final      RARE KLEBSIELLA PNEUMONIAE NO ANAEROBES ISOLATED; CULTURE IN PROGRESS FOR 5 DAYS    Report Status PENDING  Incomplete   Organism ID, Bacteria KLEBSIELLA PNEUMONIAE  Final      Susceptibility   Klebsiella pneumoniae - MIC*    AMPICILLIN >=32 RESISTANT Resistant     CEFAZOLIN <=4 SENSITIVE Sensitive     CEFEPIME 0.25 SENSITIVE Sensitive     CEFTAZIDIME <=1 SENSITIVE Sensitive     CEFTRIAXONE 1 SENSITIVE Sensitive     CIPROFLOXACIN <=0.25 SENSITIVE Sensitive     GENTAMICIN <=1 SENSITIVE Sensitive     IMIPENEM 0.5 SENSITIVE Sensitive     TRIMETH/SULFA <=20 SENSITIVE Sensitive     AMPICILLIN/SULBACTAM 16 INTERMEDIATE Intermediate     PIP/TAZO 16 SENSITIVE Sensitive     * RARE KLEBSIELLA PNEUMONIAE     Total time spend on discharging this patient, including the last patient exam, discussing the hospital stay, instructions for ongoing care as it relates to all pertinent caregivers, as well as preparing the medical discharge records, prescriptions, and/or referrals as applicable, is 60 minutes.    Darlin Priestly, MD  [facility] 02/16/2020, 10:00 AM  If 7PM-7AM, please contact night-coverage

## 2020-02-17 LAB — CULTURE, BLOOD (ROUTINE X 2)
Culture: NO GROWTH
Culture: NO GROWTH
Special Requests: ADEQUATE

## 2020-02-18 LAB — AEROBIC/ANAEROBIC CULTURE W GRAM STAIN (SURGICAL/DEEP WOUND)

## 2022-07-15 IMAGING — CT CT IMAGE GUIDED DRAINAGE BY PERCUTANEOUS CATHETER
1 of 3 series · 13 of 32 positions shown, 18 images · non-contrast
Comparison: none

INDICATION: 63-year-old female with a history of multiple fluid collections
suspicious for abscess
TECHNIQUE: Informed written consent was obtained from the patient after a
thorough discussion of the procedural risks, benefits and
alternatives. All questions were addressed. Maximal Sterile Barrier
Technique was utilized including caps, mask, sterile gowns, sterile
gloves, sterile drape, hand hygiene and skin antiseptic. A timeout
was performed prior to the initiation of the procedure.

[Series 2: i-spiral 5.0 b30f · axial · 0.79mm/px · z∈[-342,-86]mm · 13 of 85 slices shown, 18 images]
[im 6/85  soft-tissue]
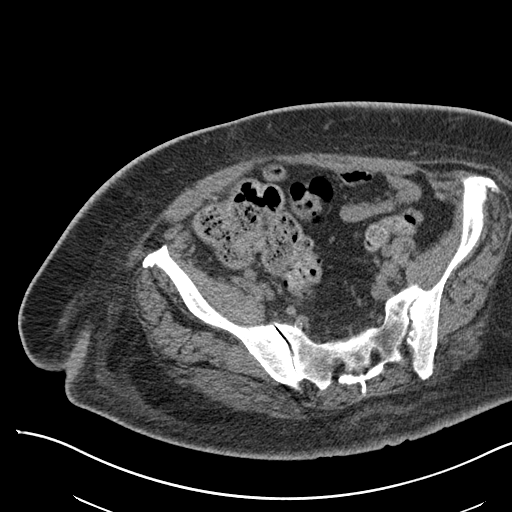
[im 6/85  bone]
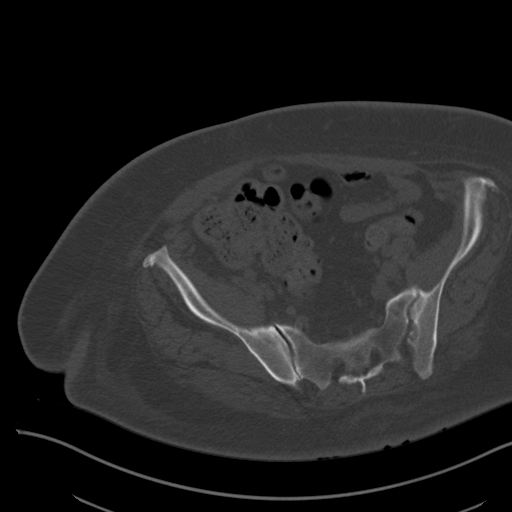
[im 12/85  soft-tissue]
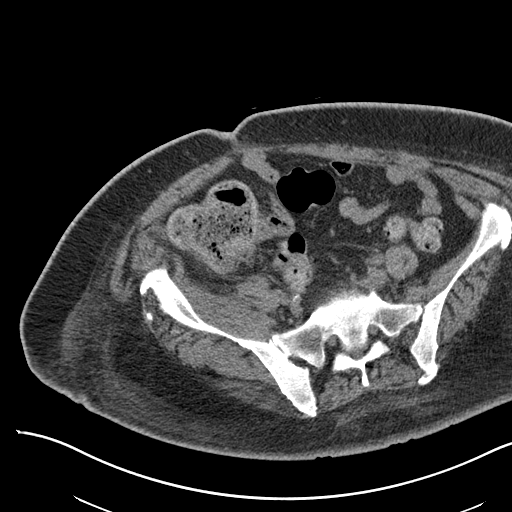
[im 17/85  soft-tissue]
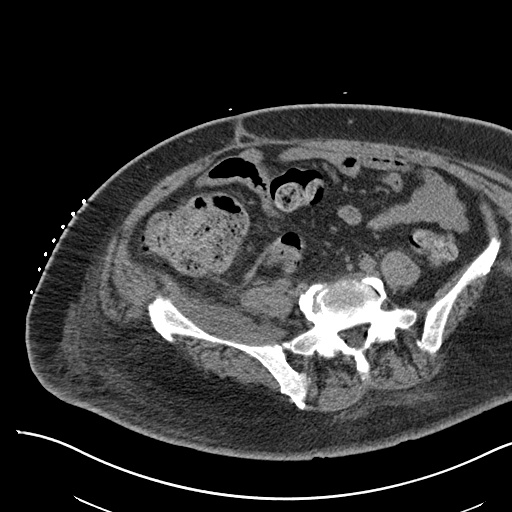
[im 29/85  soft-tissue]
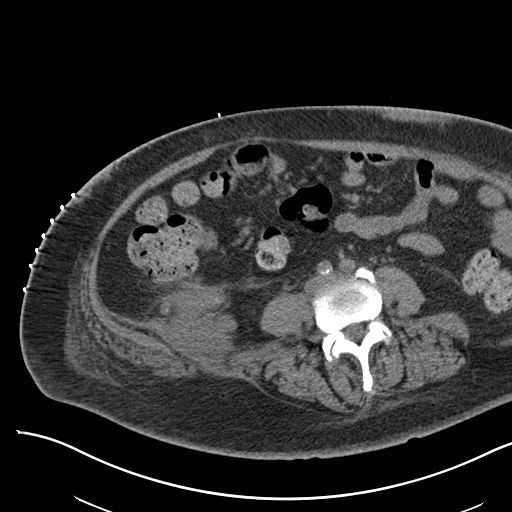
[im 34/85  soft-tissue]
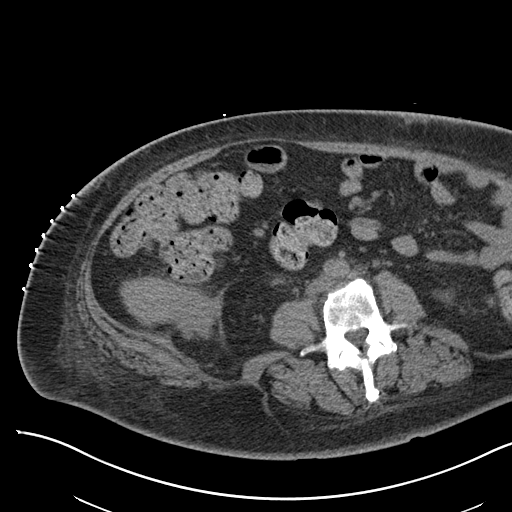
[im 40/85  soft-tissue]
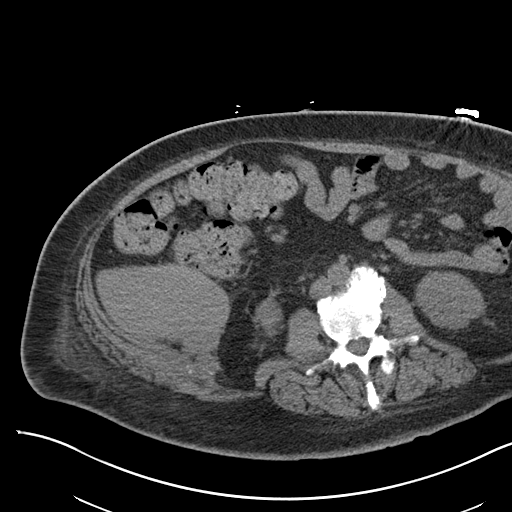
[im 45/85  soft-tissue]
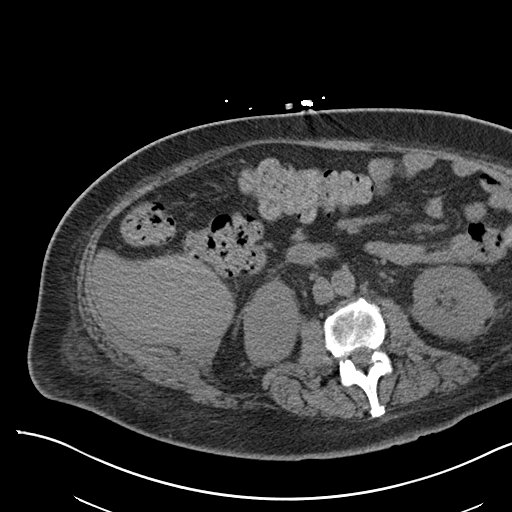
[im 51/85  soft-tissue]
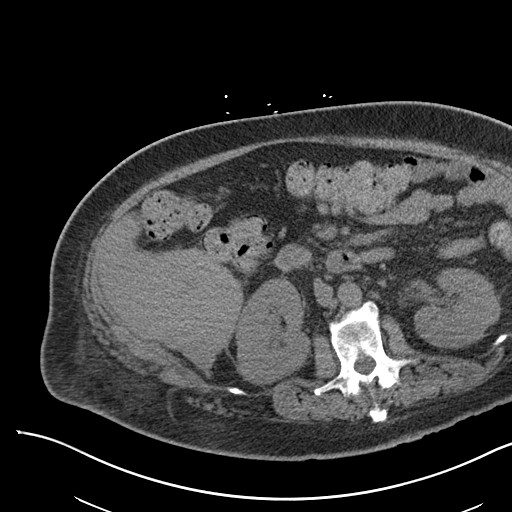
[im 57/85  soft-tissue]
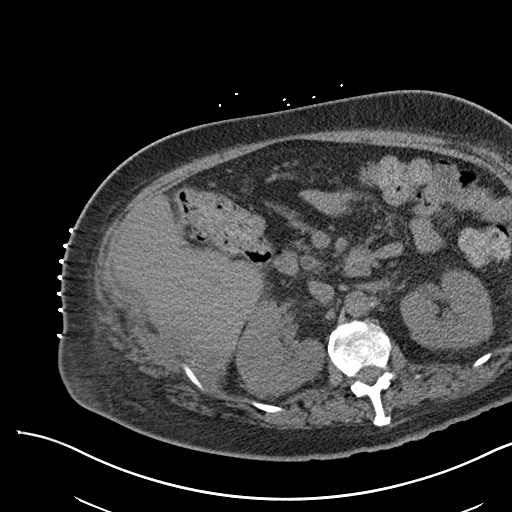
[im 57/85  bone]
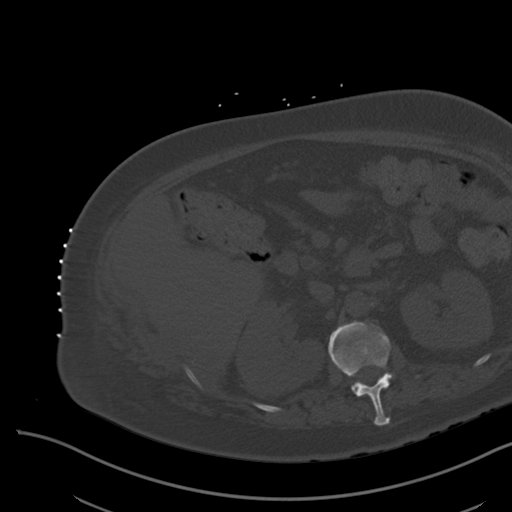
[im 62/85  lung]
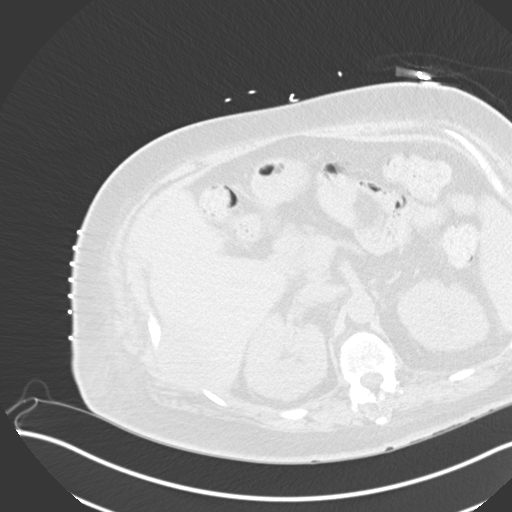
[im 68/85  soft-tissue]
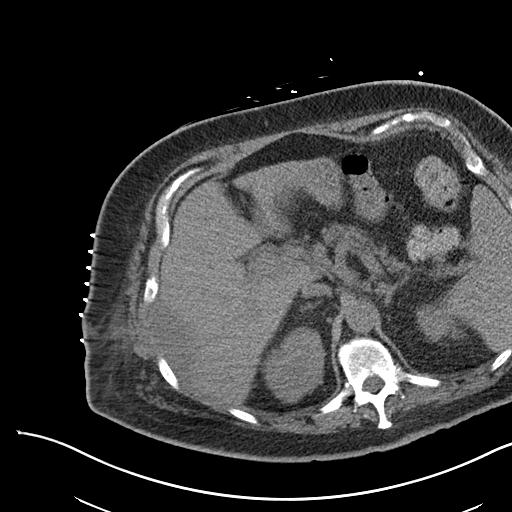
[im 68/85  lung]
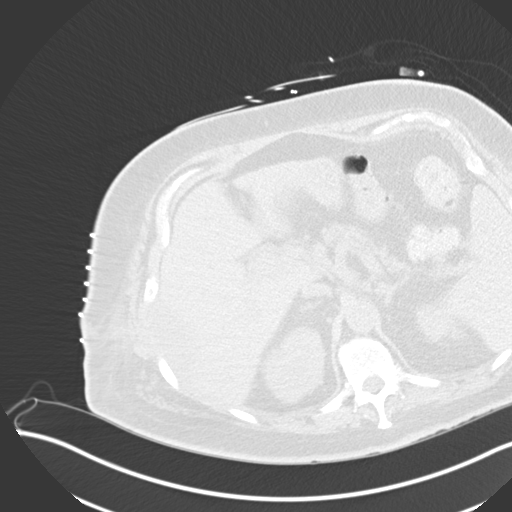
[im 73/85  soft-tissue]
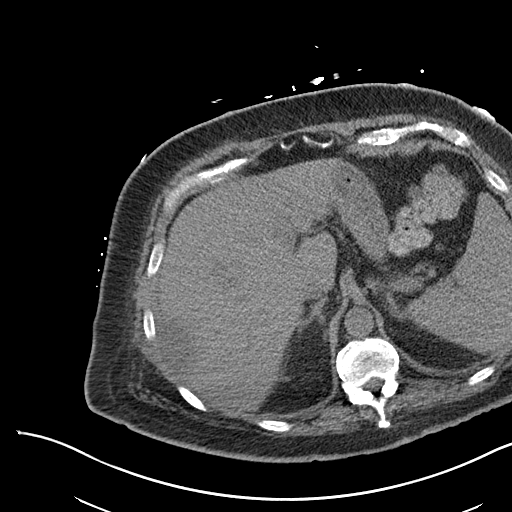
[im 73/85  lung]
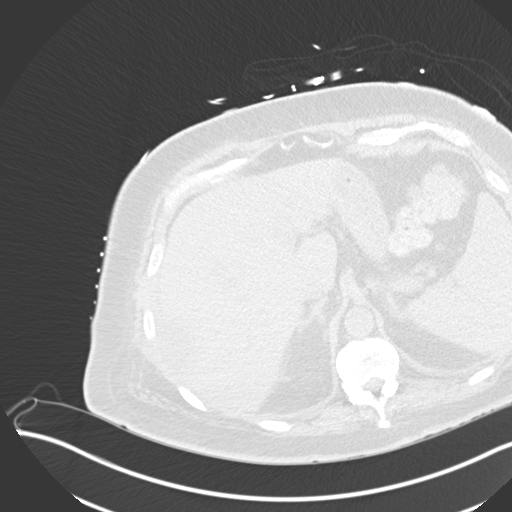
[im 79/85  soft-tissue]
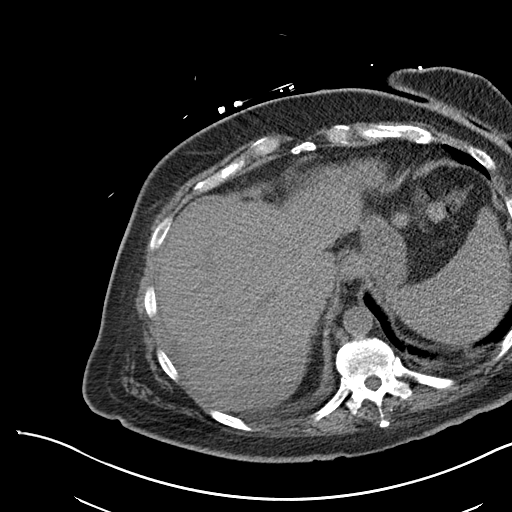
[im 79/85  lung]
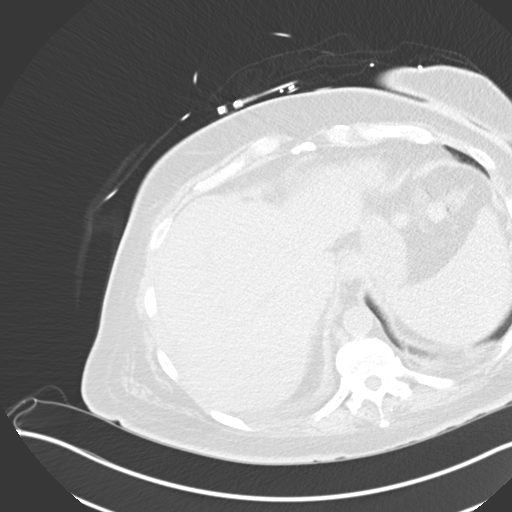

[13 of 32 positions shown; findings below may reference images not displayed]

EXAM:
CT GUIDED DRAINAGE OF  ABSCESS

MEDICATIONS:
The patient is currently admitted to the hospital and receiving
intravenous antibiotics. The antibiotics were administered within an
appropriate time frame prior to the initiation of the procedure.

ANESTHESIA/SEDATION:
2.0 mg IV Versed 100 mcg IV Fentanyl

Moderate Sedation Time:  14 minutes

The patient was continuously monitored during the procedure by the
interventional radiology nurse under my direct supervision.

COMPLICATIONS:
None
PROCEDURE:
The right upper abdomen and the right lower abdomen were prepped
with chlorhexidine in a sterile fashion, and a sterile drape was
applied covering the operative field. A sterile gown and sterile
gloves were used for the procedure. Local anesthesia was provided
with 1% Lidocaine.

Using CT guidance, trocar needle was advanced into the perihepatic
fluid collection. Once we confirmed needle tip position, modified
Seldinger technique was used to place a 10 French drain. Sample of
purulent fluid was sent for culture.

We then turned our attention to the lower collection. CT guidance
was used to place a trocar needle into the fluid collection. Once we
confirmed needle tip position, modified Seldinger technique was used
to place a 10 French drain. Sample appearance fluid was sent for
culture.

Both drains were then sutured in position and attached to bulb
suction.

Final CT was acquired. Patient tolerated the procedure well and
remained hemodynamically stable throughout.

No complications were encountered and no significant blood loss.
FINDINGS: CT demonstrates fluid collection within the right upper
quadrant/perihepatic, with the final image demonstrating well-formed
drainage catheter within the collection.

CT also demonstrates a lower fluid collection intimately associated
with the abdominal wall musculature. Final CT demonstrates pigtail
catheter formed well within this collection.
IMPRESSION: Status post CT-guided drainage of perihepatic fluid collection and
right lower quadrant fluid collection, both with 10 French drainage
to bulb suction.
